# Patient Record
Sex: Male | Born: 1965 | Race: White | Hispanic: No | Marital: Married
Health system: Southern US, Community
[De-identification: ages and names within clinical notes are randomized; demographics above are authoritative.]

---

## 2009-07-17 ENCOUNTER — Ambulatory Visit: Payer: Self-pay | Admitting: Podiatry

## 2012-02-05 ENCOUNTER — Ambulatory Visit: Payer: Self-pay | Admitting: Family Medicine

## 2012-02-19 ENCOUNTER — Ambulatory Visit: Payer: Self-pay | Admitting: Oncology

## 2012-02-19 LAB — CBC CANCER CENTER
Basophil #: 0.1 x10 3/mm (ref 0.0–0.1)
Basophil %: 1 %
Eosinophil #: 0.3 x10 3/mm (ref 0.0–0.7)
HCT: 41.9 % (ref 40.0–52.0)
HGB: 14.3 g/dL (ref 13.0–18.0)
Lymphocyte #: 2.4 x10 3/mm (ref 1.0–3.6)
Lymphocyte %: 24.5 %
MCH: 33.8 pg (ref 26.0–34.0)
MCHC: 34.2 g/dL (ref 32.0–36.0)
MCV: 99 fL (ref 80–100)
Monocyte #: 0.6 x10 3/mm (ref 0.2–1.0)
Neutrophil #: 6.3 x10 3/mm (ref 1.4–6.5)
Neutrophil %: 64.8 %
RDW: 13.2 % (ref 11.5–14.5)

## 2012-02-19 LAB — LACTATE DEHYDROGENASE: LDH: 234 U/L (ref 87–241)

## 2012-02-19 LAB — IRON AND TIBC
Iron Saturation: 16 %
Iron: 66 ug/dL (ref 65–175)

## 2012-02-19 LAB — FOLATE: Folic Acid: 15.6 ng/mL (ref 3.1–100.0)

## 2012-03-12 ENCOUNTER — Ambulatory Visit: Payer: Self-pay | Admitting: Oncology

## 2013-03-15 ENCOUNTER — Ambulatory Visit: Payer: Self-pay | Admitting: Internal Medicine

## 2013-03-16 ENCOUNTER — Inpatient Hospital Stay: Payer: Self-pay | Admitting: Psychiatry

## 2013-03-16 LAB — URINALYSIS, COMPLETE
Glucose,UR: NEGATIVE mg/dL (ref 0–75)
Nitrite: NEGATIVE
Ph: 6 (ref 4.5–8.0)
Protein: 100
RBC,UR: 1 /HPF (ref 0–5)
Specific Gravity: 1.012 (ref 1.003–1.030)
Squamous Epithelial: 1
Transitional Epi: <1
WBC UR: 3 /HPF (ref 0–5)

## 2013-03-16 LAB — CBC
HCT: 44 % (ref 40.0–52.0)
HGB: 15.2 g/dL (ref 13.0–18.0)
MCHC: 34.6 g/dL (ref 32.0–36.0)
MCV: 98 fL (ref 80–100)
Platelet: 81 10*3/uL — ABNORMAL LOW (ref 150–440)
RBC: 4.51 10*6/uL (ref 4.40–5.90)
RDW: 13.3 % (ref 11.5–14.5)

## 2013-03-16 LAB — COMPREHENSIVE METABOLIC PANEL
BUN: 8 mg/dL (ref 7–18)
Chloride: 101 mmol/L (ref 98–107)
Co2: 24 mmol/L (ref 21–32)
Creatinine: 0.72 mg/dL (ref 0.60–1.30)
Osmolality: 274 (ref 275–301)
Potassium: 3.8 mmol/L (ref 3.5–5.1)
SGOT(AST): 137 U/L — ABNORMAL HIGH (ref 15–37)
SGPT (ALT): 67 U/L (ref 12–78)
Sodium: 137 mmol/L (ref 136–145)
Total Protein: 8.4 g/dL — ABNORMAL HIGH (ref 6.4–8.2)

## 2013-03-16 LAB — DRUG SCREEN, URINE
Barbiturates, Ur Screen: NEGATIVE (ref ?–200)
Benzodiazepine, Ur Scrn: NEGATIVE (ref ?–200)
Cannabinoid 50 Ng, Ur ~~LOC~~: NEGATIVE (ref ?–50)
Cocaine Metabolite,Ur ~~LOC~~: NEGATIVE (ref ?–300)
MDMA (Ecstasy)Ur Screen: NEGATIVE (ref ?–500)
Opiate, Ur Screen: NEGATIVE (ref ?–300)
Phencyclidine (PCP) Ur S: NEGATIVE (ref ?–25)

## 2013-03-16 LAB — ETHANOL
Ethanol %: 0.109 % — ABNORMAL HIGH (ref 0.000–0.080)
Ethanol: 295 mg/dL
Ethanol: 109 mg/dL

## 2013-03-22 ENCOUNTER — Ambulatory Visit: Payer: Self-pay | Admitting: Psychiatry

## 2013-06-22 ENCOUNTER — Inpatient Hospital Stay: Payer: Self-pay | Admitting: Psychiatry

## 2013-06-22 LAB — ETHANOL: Ethanol %: 0.085 % — ABNORMAL HIGH (ref 0.000–0.080)

## 2013-06-22 LAB — URINALYSIS, COMPLETE
Bacteria: NONE SEEN
Hyaline Cast: 8
Ph: 6 (ref 4.5–8.0)
RBC,UR: 94 /HPF (ref 0–5)
Squamous Epithelial: 2
WBC UR: 10 /HPF (ref 0–5)

## 2013-06-22 LAB — COMPREHENSIVE METABOLIC PANEL
Alkaline Phosphatase: 149 U/L — ABNORMAL HIGH (ref 50–136)
Anion Gap: 15 (ref 7–16)
BUN: 8 mg/dL (ref 7–18)
Chloride: 100 mmol/L (ref 98–107)
Creatinine: 0.7 mg/dL (ref 0.60–1.30)
EGFR (African American): >60
Osmolality: 274 (ref 275–301)
SGOT(AST): 85 U/L — ABNORMAL HIGH (ref 15–37)
Sodium: 137 mmol/L (ref 136–145)
Total Protein: 7.5 g/dL (ref 6.4–8.2)

## 2013-06-22 LAB — CBC
HCT: 38.4 % — ABNORMAL LOW (ref 40.0–52.0)
HGB: 13.1 g/dL (ref 13.0–18.0)
MCH: 31.7 pg (ref 26.0–34.0)
MCV: 93 fL (ref 80–100)
RDW: 14.9 % — ABNORMAL HIGH (ref 11.5–14.5)

## 2013-06-22 LAB — DRUG SCREEN, URINE
Amphetamines, Ur Screen: NEGATIVE (ref ?–1000)
Benzodiazepine, Ur Scrn: NEGATIVE (ref ?–200)
Cannabinoid 50 Ng, Ur ~~LOC~~: NEGATIVE (ref ?–50)
Cocaine Metabolite,Ur ~~LOC~~: NEGATIVE (ref ?–300)
MDMA (Ecstasy)Ur Screen: NEGATIVE (ref ?–500)
Methadone, Ur Screen: NEGATIVE (ref ?–300)
Phencyclidine (PCP) Ur S: NEGATIVE (ref ?–25)

## 2013-06-26 DIAGNOSIS — R079 Chest pain, unspecified: Secondary | ICD-10-CM

## 2013-06-26 LAB — T4, FREE: Free Thyroxine: 1.39 ng/dL (ref 0.76–1.46)

## 2013-06-26 LAB — CK TOTAL AND CKMB (NOT AT ARMC): CK-MB: 0.7 ng/mL (ref 0.5–3.6)

## 2013-06-27 LAB — TROPONIN I: Troponin-I: <0.02 ng/mL

## 2013-06-27 LAB — CK TOTAL AND CKMB (NOT AT ARMC)
CK, Total: 166 U/L (ref 35–232)
CK, Total: 158 U/L
CK-MB: 0.6 ng/mL (ref 0.5–3.6)
CK-MB: <0.5 ng/mL — ABNORMAL LOW

## 2013-06-28 LAB — COMPREHENSIVE METABOLIC PANEL
Albumin: 3.2 g/dL — ABNORMAL LOW (ref 3.4–5.0)
Anion Gap: 5 — ABNORMAL LOW (ref 7–16)
Chloride: 99 mmol/L (ref 98–107)
Co2: 30 mmol/L (ref 21–32)
EGFR (African American): 52 — ABNORMAL LOW
EGFR (Non-African Amer.): 45 — ABNORMAL LOW
Glucose: 99 mg/dL (ref 65–99)
Osmolality: 277 (ref 275–301)
Potassium: 3.7 mmol/L (ref 3.5–5.1)
SGOT(AST): 101 U/L — ABNORMAL HIGH (ref 15–37)
SGPT (ALT): 43 U/L (ref 12–78)
Sodium: 134 mmol/L — ABNORMAL LOW (ref 136–145)

## 2013-06-28 LAB — MAGNESIUM: Magnesium: 1.3 mg/dL — ABNORMAL LOW

## 2013-06-30 LAB — MAGNESIUM: Magnesium: 0.9 mg/dL — ABNORMAL LOW

## 2013-06-30 LAB — CREATININE, SERUM: Creatinine: 1.06 mg/dL (ref 0.60–1.30)

## 2013-07-23 ENCOUNTER — Emergency Department: Payer: Self-pay | Admitting: Emergency Medicine

## 2013-07-23 LAB — COMPREHENSIVE METABOLIC PANEL
Albumin: 3.4 g/dL (ref 3.4–5.0)
Alkaline Phosphatase: 162 U/L — ABNORMAL HIGH
BUN: 5 mg/dL — ABNORMAL LOW (ref 7–18)
Bilirubin,Total: 0.5 mg/dL (ref 0.2–1.0)
Calcium, Total: 9.4 mg/dL (ref 8.5–10.1)
Co2: 27 mmol/L (ref 21–32)
Creatinine: 0.77 mg/dL (ref 0.60–1.30)
EGFR (Non-African Amer.): >60
Glucose: 114 mg/dL — ABNORMAL HIGH (ref 65–99)
Osmolality: 281 (ref 275–301)
Potassium: 3.5 mmol/L (ref 3.5–5.1)
SGOT(AST): 58 U/L — ABNORMAL HIGH (ref 15–37)
SGPT (ALT): 41 U/L (ref 12–78)
Total Protein: 8.2 g/dL (ref 6.4–8.2)

## 2013-07-23 LAB — CBC
MCH: 30.3 pg (ref 26.0–34.0)
MCV: 92 fL (ref 80–100)
Platelet: 156 10*3/uL (ref 150–440)
RBC: 4.75 10*6/uL (ref 4.40–5.90)
RDW: 14.2 % (ref 11.5–14.5)

## 2013-07-23 LAB — SALICYLATE LEVEL: Salicylates, Serum: <1.7 mg/dL

## 2013-07-23 LAB — ETHANOL
Ethanol %: 0.348 % (ref 0.000–0.080)
Ethanol: 348 mg/dL

## 2013-07-24 LAB — AMMONIA: Ammonia, Plasma: 30 mcmol/L (ref 11–32)

## 2013-09-09 ENCOUNTER — Emergency Department: Payer: Self-pay | Admitting: Emergency Medicine

## 2013-09-09 LAB — COMPREHENSIVE METABOLIC PANEL
ALK PHOS: 134 U/L — AB
AST: 44 U/L — AB (ref 15–37)
Albumin: 3.9 g/dL (ref 3.4–5.0)
Anion Gap: 10 (ref 7–16)
BUN: 10 mg/dL (ref 7–18)
Bilirubin,Total: 0.4 mg/dL (ref 0.2–1.0)
CALCIUM: 9.1 mg/dL (ref 8.5–10.1)
Chloride: 108 mmol/L — ABNORMAL HIGH (ref 98–107)
Co2: 26 mmol/L (ref 21–32)
Creatinine: 0.83 mg/dL (ref 0.60–1.30)
EGFR (Non-African Amer.): >60
Glucose: 92 mg/dL (ref 65–99)
OSMOLALITY: 286 (ref 275–301)
POTASSIUM: 4 mmol/L (ref 3.5–5.1)
SGPT (ALT): 24 U/L (ref 12–78)
SODIUM: 144 mmol/L (ref 136–145)
Total Protein: 8.7 g/dL — ABNORMAL HIGH (ref 6.4–8.2)

## 2013-09-09 LAB — SALICYLATE LEVEL: SALICYLATES, SERUM: 2.8 mg/dL

## 2013-09-09 LAB — ETHANOL
ETHANOL LVL: 383 mg/dL — AB
Ethanol %: 0.383 % (ref 0.000–0.080)

## 2013-09-09 LAB — CBC
HCT: 49.4 % (ref 40.0–52.0)
HGB: 15.9 g/dL (ref 13.0–18.0)
MCH: 29.4 pg (ref 26.0–34.0)
MCHC: 32.2 g/dL (ref 32.0–36.0)
MCV: 91 fL (ref 80–100)
Platelet: 217 10*3/uL (ref 150–440)
RBC: 5.4 10*6/uL (ref 4.40–5.90)
RDW: 14.8 % — ABNORMAL HIGH (ref 11.5–14.5)
WBC: 13.6 10*3/uL — AB (ref 3.8–10.6)

## 2013-09-09 LAB — ACETAMINOPHEN LEVEL: Acetaminophen: <2 ug/mL

## 2013-09-10 LAB — URINALYSIS, COMPLETE
Bilirubin,UR: NEGATIVE
Blood: NEGATIVE
Glucose,UR: NEGATIVE mg/dL (ref 0–75)
Hyaline Cast: 48
Leukocyte Esterase: NEGATIVE
Nitrite: NEGATIVE
Ph: 5 (ref 4.5–8.0)
Protein: 100
RBC,UR: 1 /HPF (ref 0–5)
Specific Gravity: 1.02 (ref 1.003–1.030)
Squamous Epithelial: 1
WBC UR: 3 /HPF (ref 0–5)

## 2013-09-10 LAB — DRUG SCREEN, URINE

## 2013-12-28 ENCOUNTER — Inpatient Hospital Stay: Payer: Self-pay | Admitting: Specialist

## 2013-12-28 LAB — TROPONIN I
TROPONIN-I: 0.03 ng/mL
TROPONIN-I: 0.03 ng/mL
Troponin-I: 0.03 ng/mL

## 2013-12-28 LAB — ETHANOL
ETHANOL %: 0.076 % (ref 0.000–0.080)
ETHANOL LVL: 76 mg/dL

## 2013-12-28 LAB — CBC WITH DIFFERENTIAL/PLATELET
Bands: 4 %
Comment - H1-Com2: NORMAL
HCT: 35.1 % — AB (ref 40.0–52.0)
HGB: 11.6 g/dL — AB (ref 13.0–18.0)
LYMPHS PCT: 10 %
MCH: 33 pg (ref 26.0–34.0)
MCHC: 33.1 g/dL (ref 32.0–36.0)
MCV: 100 fL (ref 80–100)
MONOS PCT: 9 %
Platelet: 110 10*3/uL — ABNORMAL LOW (ref 150–440)
RBC: 3.53 10*6/uL — ABNORMAL LOW (ref 4.40–5.90)
RDW: 22.4 % — AB (ref 11.5–14.5)
Segmented Neutrophils: 77 %
WBC: 10.2 10*3/uL (ref 3.8–10.6)

## 2013-12-28 LAB — COMPREHENSIVE METABOLIC PANEL
Albumin: 2.2 g/dL — ABNORMAL LOW (ref 3.4–5.0)
Alkaline Phosphatase: 521 U/L — ABNORMAL HIGH
Anion Gap: 23 — ABNORMAL HIGH (ref 7–16)
BILIRUBIN TOTAL: 24.6 mg/dL — AB (ref 0.2–1.0)
BUN: 23 mg/dL — ABNORMAL HIGH (ref 7–18)
CREATININE: 1.93 mg/dL — AB (ref 0.60–1.30)
Calcium, Total: 6.4 mg/dL — CL (ref 8.5–10.1)
Chloride: 94 mmol/L — ABNORMAL LOW (ref 98–107)
Co2: 17 mmol/L — ABNORMAL LOW (ref 21–32)
GFR CALC AF AMER: 46 — AB
GFR CALC NON AF AMER: 40 — AB
GLUCOSE: 91 mg/dL (ref 65–99)
OSMOLALITY: 272 (ref 275–301)
POTASSIUM: 2.9 mmol/L — AB (ref 3.5–5.1)
SGOT(AST): 399 U/L — ABNORMAL HIGH (ref 15–37)
SGPT (ALT): 71 U/L (ref 12–78)
Sodium: 134 mmol/L — ABNORMAL LOW (ref 136–145)
Total Protein: 6.1 g/dL — ABNORMAL LOW (ref 6.4–8.2)

## 2013-12-28 LAB — MAGNESIUM: Magnesium: 0.6 mg/dL — ABNORMAL LOW

## 2013-12-28 LAB — APTT: ACTIVATED PTT: 48.8 s — AB (ref 23.6–35.9)

## 2013-12-28 LAB — PROTIME-INR
INR: 1.3
Prothrombin Time: 16.4 secs — ABNORMAL HIGH (ref 11.5–14.7)

## 2013-12-28 LAB — LIPASE, BLOOD: Lipase: 1402 U/L — ABNORMAL HIGH (ref 73–393)

## 2013-12-28 LAB — AMMONIA: Ammonia, Plasma: 87 mcmol/L — ABNORMAL HIGH (ref 11–32)

## 2013-12-28 LAB — HEMOGLOBIN: HGB: 10.7 g/dL — AB (ref 13.0–18.0)

## 2013-12-28 LAB — PRO B NATRIURETIC PEPTIDE: B-Type Natriuretic Peptide: 63 pg/mL (ref 0–125)

## 2013-12-29 LAB — LIPID PANEL
Cholesterol: 224 mg/dL — ABNORMAL HIGH (ref 0–200)
HDL Cholesterol: 5 mg/dL — ABNORMAL LOW (ref 40–60)
Triglycerides: 412 mg/dL — ABNORMAL HIGH (ref 0–200)

## 2013-12-29 LAB — URINALYSIS, COMPLETE
Glucose,UR: NEGATIVE mg/dL (ref 0–75)
Hyaline Cast: 27
NITRITE: NEGATIVE
Ph: 5 (ref 4.5–8.0)
Protein: 30
SQUAMOUS EPITHELIAL: NONE SEEN
Specific Gravity: 1.018 (ref 1.003–1.030)
WBC UR: 19 /HPF (ref 0–5)

## 2013-12-29 LAB — DRUG SCREEN, URINE

## 2013-12-29 LAB — CBC WITH DIFFERENTIAL/PLATELET
Bands: 5 %
COMMENT - H1-COM3: NORMAL
EOS PCT: 1 %
HCT: 33 % — AB (ref 40.0–52.0)
HGB: 11.3 g/dL — ABNORMAL LOW (ref 13.0–18.0)
MCH: 33.6 pg (ref 26.0–34.0)
MCHC: 34.3 g/dL (ref 32.0–36.0)
MCV: 98 fL (ref 80–100)
MONOS PCT: 6 %
Platelet: 85 10*3/uL — ABNORMAL LOW (ref 150–440)
RBC: 3.37 10*6/uL — AB (ref 4.40–5.90)
RDW: 22.2 % — ABNORMAL HIGH (ref 11.5–14.5)
SEGMENTED NEUTROPHILS: 77 %
VARIANT LYMPHOCYTE - H1-RLYMPH: 11 %
WBC: 8 10*3/uL (ref 3.8–10.6)

## 2013-12-29 LAB — PROTIME-INR
INR: 1.5
Prothrombin Time: 17.8 secs — ABNORMAL HIGH (ref 11.5–14.7)

## 2013-12-29 LAB — COMPREHENSIVE METABOLIC PANEL
ALBUMIN: 2.1 g/dL — AB (ref 3.4–5.0)
ALT: 76 U/L (ref 12–78)
AST: 384 U/L — AB (ref 15–37)
Alkaline Phosphatase: 463 U/L — ABNORMAL HIGH
Anion Gap: 17 — ABNORMAL HIGH (ref 7–16)
BILIRUBIN TOTAL: 24 mg/dL — AB (ref 0.2–1.0)
BUN: 27 mg/dL — AB (ref 7–18)
Calcium, Total: 6.4 mg/dL — CL (ref 8.5–10.1)
Chloride: 102 mmol/L (ref 98–107)
Co2: 18 mmol/L — ABNORMAL LOW (ref 21–32)
Creatinine: 1.88 mg/dL — ABNORMAL HIGH (ref 0.60–1.30)
EGFR (African American): 48 — ABNORMAL LOW
EGFR (Non-African Amer.): 41 — ABNORMAL LOW
Glucose: 90 mg/dL (ref 65–99)
Osmolality: 278 (ref 275–301)
POTASSIUM: 2.9 mmol/L — AB (ref 3.5–5.1)
Sodium: 137 mmol/L (ref 136–145)
Total Protein: 5.8 g/dL — ABNORMAL LOW (ref 6.4–8.2)

## 2013-12-29 LAB — HEMOGLOBIN: HGB: 12.1 g/dL — ABNORMAL LOW (ref 13.0–18.0)

## 2013-12-29 LAB — LIPASE, BLOOD: Lipase: 2553 U/L — ABNORMAL HIGH (ref 73–393)

## 2013-12-29 LAB — CLOSTRIDIUM DIFFICILE(ARMC)

## 2013-12-29 LAB — MAGNESIUM: MAGNESIUM: 1.8 mg/dL

## 2013-12-30 LAB — CBC WITH DIFFERENTIAL/PLATELET
HCT: 33.8 % — ABNORMAL LOW (ref 40.0–52.0)
HGB: 11.5 g/dL — AB (ref 13.0–18.0)
LYMPHS PCT: 11 %
MCH: 33.9 pg (ref 26.0–34.0)
MCHC: 34 g/dL (ref 32.0–36.0)
MCV: 100 fL (ref 80–100)
MONOS PCT: 8 %
Platelet: 99 10*3/uL — ABNORMAL LOW (ref 150–440)
RBC: 3.39 10*6/uL — AB (ref 4.40–5.90)
RDW: 22.6 % — AB (ref 11.5–14.5)
Segmented Neutrophils: 81 %
WBC: 8.5 10*3/uL (ref 3.8–10.6)

## 2013-12-30 LAB — COMPREHENSIVE METABOLIC PANEL
ALK PHOS: 432 U/L — AB
Albumin: 2.1 g/dL — ABNORMAL LOW (ref 3.4–5.0)
Anion Gap: 17 — ABNORMAL HIGH (ref 7–16)
BUN: 22 mg/dL — ABNORMAL HIGH (ref 7–18)
Bilirubin,Total: 25.5 mg/dL — ABNORMAL HIGH (ref 0.2–1.0)
CALCIUM: 6.4 mg/dL — AB (ref 8.5–10.1)
CHLORIDE: 110 mmol/L — AB (ref 98–107)
CO2: 15 mmol/L — AB (ref 21–32)
Creatinine: 1.1 mg/dL (ref 0.60–1.30)
EGFR (Non-African Amer.): >60
Glucose: 78 mg/dL (ref 65–99)
OSMOLALITY: 285 (ref 275–301)
Potassium: 3.5 mmol/L (ref 3.5–5.1)
SGOT(AST): 329 U/L — ABNORMAL HIGH (ref 15–37)
SGPT (ALT): 78 U/L (ref 12–78)
Sodium: 142 mmol/L (ref 136–145)
Total Protein: 5.5 g/dL — ABNORMAL LOW (ref 6.4–8.2)

## 2013-12-30 LAB — LIPASE, BLOOD: Lipase: 4733 U/L — ABNORMAL HIGH (ref 73–393)

## 2013-12-30 LAB — WBCS, STOOL

## 2013-12-31 LAB — COMPREHENSIVE METABOLIC PANEL
ALBUMIN: 2 g/dL — AB (ref 3.4–5.0)
AST: 320 U/L — AB (ref 15–37)
Alkaline Phosphatase: 416 U/L — ABNORMAL HIGH
Anion Gap: 12 (ref 7–16)
BUN: 15 mg/dL (ref 7–18)
Bilirubin,Total: 25 mg/dL — ABNORMAL HIGH (ref 0.2–1.0)
Calcium, Total: 7 mg/dL — CL (ref 8.5–10.1)
Chloride: 114 mmol/L — ABNORMAL HIGH (ref 98–107)
Co2: 21 mmol/L (ref 21–32)
Creatinine: 1.17 mg/dL (ref 0.60–1.30)
EGFR (African American): >60
Glucose: 55 mg/dL — ABNORMAL LOW (ref 65–99)
Osmolality: 291 (ref 275–301)
Potassium: 3.4 mmol/L — ABNORMAL LOW (ref 3.5–5.1)
SGPT (ALT): 84 U/L — ABNORMAL HIGH (ref 12–78)
Sodium: 147 mmol/L — ABNORMAL HIGH (ref 136–145)
Total Protein: 5.4 g/dL — ABNORMAL LOW (ref 6.4–8.2)

## 2013-12-31 LAB — PLATELET COUNT: PLATELETS: 112 10*3/uL — AB (ref 150–440)

## 2013-12-31 LAB — HEMOGLOBIN: HGB: 11.6 g/dL — ABNORMAL LOW (ref 13.0–18.0)

## 2013-12-31 LAB — AMMONIA: Ammonia, Plasma: <10 mcmol/L (ref 11–32)

## 2013-12-31 LAB — LIPASE, BLOOD: Lipase: 6218 U/L — ABNORMAL HIGH (ref 73–393)

## 2013-12-31 LAB — STOOL CULTURE

## 2013-12-31 LAB — PROTIME-INR
INR: 1.5
Prothrombin Time: 17.8 secs — ABNORMAL HIGH (ref 11.5–14.7)

## 2014-01-01 LAB — PLATELET COUNT: PLATELETS: 119 10*3/uL — AB (ref 150–440)

## 2014-01-01 LAB — LIPASE, BLOOD: Lipase: 3987 U/L — ABNORMAL HIGH (ref 73–393)

## 2014-01-01 LAB — BASIC METABOLIC PANEL
Anion Gap: 11 (ref 7–16)
BUN: 12 mg/dL (ref 7–18)
CHLORIDE: 110 mmol/L — AB (ref 98–107)
Calcium, Total: 6.6 mg/dL — CL (ref 8.5–10.1)
Co2: 23 mmol/L (ref 21–32)
Creatinine: 1.01 mg/dL (ref 0.60–1.30)
EGFR (Non-African Amer.): >60
GLUCOSE: 82 mg/dL (ref 65–99)
Osmolality: 286 (ref 275–301)
POTASSIUM: 2.7 mmol/L — AB (ref 3.5–5.1)
Sodium: 144 mmol/L (ref 136–145)

## 2014-01-01 LAB — POTASSIUM: Potassium: 3.3 mmol/L — ABNORMAL LOW (ref 3.5–5.1)

## 2014-01-01 LAB — MAGNESIUM: MAGNESIUM: 1.1 mg/dL — AB

## 2014-01-02 LAB — HEPATIC FUNCTION PANEL A (ARMC)
ALK PHOS: 362 U/L — AB
Albumin: 1.8 g/dL — ABNORMAL LOW (ref 3.4–5.0)
BILIRUBIN DIRECT: 20.5 mg/dL — AB (ref 0.00–0.20)
BILIRUBIN TOTAL: 25.2 mg/dL — AB (ref 0.2–1.0)
SGOT(AST): 252 U/L — ABNORMAL HIGH (ref 15–37)
SGPT (ALT): 78 U/L (ref 12–78)
Total Protein: 5 g/dL — ABNORMAL LOW (ref 6.4–8.2)

## 2014-01-02 LAB — URINE CULTURE

## 2014-01-02 LAB — POTASSIUM
POTASSIUM: 3.8 mmol/L (ref 3.5–5.1)
Potassium: 3.3 mmol/L — ABNORMAL LOW (ref 3.5–5.1)

## 2014-01-02 LAB — LIPASE, BLOOD: Lipase: 5744 U/L — ABNORMAL HIGH (ref 73–393)

## 2014-01-02 LAB — PROTIME-INR
INR: 1.6
PROTHROMBIN TIME: 18.5 s — AB (ref 11.5–14.7)

## 2014-01-02 LAB — MAGNESIUM: MAGNESIUM: 2.5 mg/dL — AB

## 2014-01-02 LAB — PLATELET COUNT: Platelet: 152 10*3/uL (ref 150–440)

## 2014-01-03 LAB — COMPREHENSIVE METABOLIC PANEL
ANION GAP: 11 (ref 7–16)
AST: 210 U/L — AB (ref 15–37)
Albumin: 1.7 g/dL — ABNORMAL LOW (ref 3.4–5.0)
Alkaline Phosphatase: 356 U/L — ABNORMAL HIGH
BUN: 19 mg/dL — ABNORMAL HIGH (ref 7–18)
Bilirubin,Total: 30.6 mg/dL — ABNORMAL HIGH (ref 0.2–1.0)
CHLORIDE: 115 mmol/L — AB (ref 98–107)
CO2: 15 mmol/L — AB (ref 21–32)
Calcium, Total: 6.9 mg/dL — CL (ref 8.5–10.1)
Creatinine: 1.56 mg/dL — ABNORMAL HIGH (ref 0.60–1.30)
EGFR (African American): 60 — ABNORMAL LOW
GFR CALC NON AF AMER: 51 — AB
GLUCOSE: 100 mg/dL — AB (ref 65–99)
Osmolality: 284 (ref 275–301)
Potassium: 3.9 mmol/L (ref 3.5–5.1)
SGPT (ALT): 69 U/L (ref 12–78)
Sodium: 141 mmol/L (ref 136–145)
Total Protein: 4.8 g/dL — ABNORMAL LOW (ref 6.4–8.2)

## 2014-01-03 LAB — LIPASE, BLOOD: Lipase: 1482 U/L — ABNORMAL HIGH (ref 73–393)

## 2014-01-04 LAB — COMPREHENSIVE METABOLIC PANEL
ALK PHOS: 346 U/L — AB
ALT: 65 U/L (ref 12–78)
AST: 186 U/L — AB (ref 15–37)
Albumin: 1.7 g/dL — ABNORMAL LOW (ref 3.4–5.0)
Anion Gap: 9 (ref 7–16)
BUN: 19 mg/dL — ABNORMAL HIGH (ref 7–18)
Bilirubin,Total: 28 mg/dL — ABNORMAL HIGH (ref 0.2–1.0)
CALCIUM: 7.2 mg/dL — AB (ref 8.5–10.1)
CREATININE: 1.61 mg/dL — AB (ref 0.60–1.30)
Chloride: 115 mmol/L — ABNORMAL HIGH (ref 98–107)
Co2: 17 mmol/L — ABNORMAL LOW (ref 21–32)
EGFR (African American): 57 — ABNORMAL LOW
EGFR (Non-African Amer.): 49 — ABNORMAL LOW
Glucose: 88 mg/dL (ref 65–99)
Osmolality: 283 (ref 275–301)
Potassium: 3.3 mmol/L — ABNORMAL LOW (ref 3.5–5.1)
Sodium: 141 mmol/L (ref 136–145)
Total Protein: 5.1 g/dL — ABNORMAL LOW (ref 6.4–8.2)

## 2014-01-04 LAB — CBC WITH DIFFERENTIAL/PLATELET
Bands: 4 %
HCT: 37.3 % — ABNORMAL LOW (ref 40.0–52.0)
HGB: 12.5 g/dL — AB (ref 13.0–18.0)
Lymphocytes: 17 %
MCH: 34.8 pg — ABNORMAL HIGH (ref 26.0–34.0)
MCHC: 33.5 g/dL (ref 32.0–36.0)
MCV: 104 fL — ABNORMAL HIGH (ref 80–100)
Metamyelocyte: 1 %
Monocytes: 9 %
PLATELETS: 150 10*3/uL (ref 150–440)
RBC: 3.59 10*6/uL — ABNORMAL LOW (ref 4.40–5.90)
RDW: 23 % — ABNORMAL HIGH (ref 11.5–14.5)
SEGMENTED NEUTROPHILS: 69 %
WBC: 10.9 10*3/uL — ABNORMAL HIGH (ref 3.8–10.6)

## 2014-01-04 LAB — LIPASE, BLOOD: Lipase: 1329 U/L — ABNORMAL HIGH (ref 73–393)

## 2014-01-04 LAB — CLOSTRIDIUM DIFFICILE(ARMC)

## 2014-01-05 LAB — COMPREHENSIVE METABOLIC PANEL
AST: 143 U/L — AB (ref 15–37)
Albumin: 1.5 g/dL — ABNORMAL LOW (ref 3.4–5.0)
Alkaline Phosphatase: 311 U/L — ABNORMAL HIGH
Anion Gap: 10 (ref 7–16)
BILIRUBIN TOTAL: 29.1 mg/dL — AB (ref 0.2–1.0)
BUN: 20 mg/dL — ABNORMAL HIGH (ref 7–18)
CALCIUM: 7.6 mg/dL — AB (ref 8.5–10.1)
CO2: 15 mmol/L — AB (ref 21–32)
Chloride: 116 mmol/L — ABNORMAL HIGH (ref 98–107)
Creatinine: 1.36 mg/dL — ABNORMAL HIGH (ref 0.60–1.30)
EGFR (African American): >60
GLUCOSE: 87 mg/dL (ref 65–99)
OSMOLALITY: 283 (ref 275–301)
Potassium: 2.9 mmol/L — ABNORMAL LOW (ref 3.5–5.1)
SGPT (ALT): 55 U/L (ref 12–78)
Sodium: 141 mmol/L (ref 136–145)
TOTAL PROTEIN: 4.4 g/dL — AB (ref 6.4–8.2)

## 2014-01-05 LAB — PROTIME-INR
INR: 1.7
Prothrombin Time: 19.3 secs — ABNORMAL HIGH (ref 11.5–14.7)

## 2014-01-05 LAB — LIPASE, BLOOD: LIPASE: 1103 U/L — AB (ref 73–393)

## 2014-01-06 LAB — BASIC METABOLIC PANEL
Anion Gap: 8 (ref 7–16)
BUN: 26 mg/dL — ABNORMAL HIGH (ref 7–18)
CHLORIDE: 117 mmol/L — AB (ref 98–107)
Calcium, Total: 7.6 mg/dL — ABNORMAL LOW (ref 8.5–10.1)
Co2: 17 mmol/L — ABNORMAL LOW (ref 21–32)
Creatinine: 1.59 mg/dL — ABNORMAL HIGH (ref 0.60–1.30)
EGFR (African American): 58 — ABNORMAL LOW
EGFR (Non-African Amer.): 50 — ABNORMAL LOW
Glucose: 74 mg/dL (ref 65–99)
OSMOLALITY: 287 (ref 275–301)
Potassium: 3.1 mmol/L — ABNORMAL LOW (ref 3.5–5.1)
Sodium: 142 mmol/L (ref 136–145)

## 2014-01-06 LAB — HEPATIC FUNCTION PANEL A (ARMC)
ALBUMIN: 1.5 g/dL — AB (ref 3.4–5.0)
ALK PHOS: 310 U/L — AB
AST: 137 U/L — AB (ref 15–37)
Bilirubin, Direct: 21.5 mg/dL — ABNORMAL HIGH (ref 0.00–0.20)
Bilirubin,Total: 28.6 mg/dL — ABNORMAL HIGH (ref 0.2–1.0)
SGPT (ALT): 52 U/L (ref 12–78)
TOTAL PROTEIN: 4.5 g/dL — AB (ref 6.4–8.2)

## 2014-01-07 LAB — BASIC METABOLIC PANEL
ANION GAP: 14 (ref 7–16)
BUN: 27 mg/dL — AB (ref 7–18)
CO2: 13 mmol/L — AB (ref 21–32)
CREATININE: 1.57 mg/dL — AB (ref 0.60–1.30)
Calcium, Total: 7.7 mg/dL — ABNORMAL LOW (ref 8.5–10.1)
Chloride: 118 mmol/L — ABNORMAL HIGH (ref 98–107)
EGFR (Non-African Amer.): 51 — ABNORMAL LOW
GFR CALC AF AMER: 59 — AB
GLUCOSE: 79 mg/dL (ref 65–99)
Osmolality: 293 (ref 275–301)
Potassium: 2.8 mmol/L — ABNORMAL LOW (ref 3.5–5.1)
SODIUM: 145 mmol/L (ref 136–145)

## 2014-01-08 LAB — AMMONIA: AMMONIA, PLASMA: 48 umol/L — AB (ref 11–32)

## 2014-01-08 LAB — BASIC METABOLIC PANEL
ANION GAP: 9 (ref 7–16)
BUN: 26 mg/dL — ABNORMAL HIGH (ref 7–18)
CO2: 15 mmol/L — AB (ref 21–32)
CREATININE: 1.52 mg/dL — AB (ref 0.60–1.30)
Calcium, Total: 7.8 mg/dL — ABNORMAL LOW (ref 8.5–10.1)
Chloride: 119 mmol/L — ABNORMAL HIGH (ref 98–107)
EGFR (African American): >60
EGFR (Non-African Amer.): 53 — ABNORMAL LOW
GLUCOSE: 80 mg/dL (ref 65–99)
Osmolality: 289 (ref 275–301)
POTASSIUM: 2.9 mmol/L — AB (ref 3.5–5.1)
SODIUM: 143 mmol/L (ref 136–145)

## 2014-01-08 LAB — HEPATIC FUNCTION PANEL A (ARMC)
ALBUMIN: 2.8 g/dL — AB (ref 3.4–5.0)
AST: 112 U/L — AB (ref 15–37)
Alkaline Phosphatase: 221 U/L — ABNORMAL HIGH
Bilirubin, Direct: 21.8 mg/dL — ABNORMAL HIGH (ref 0.00–0.20)
Bilirubin,Total: 28.5 mg/dL — ABNORMAL HIGH (ref 0.2–1.0)
SGPT (ALT): 39 U/L (ref 12–78)
Total Protein: 4.6 g/dL — ABNORMAL LOW (ref 6.4–8.2)

## 2014-01-08 LAB — PROTIME-INR
INR: 1.5
Prothrombin Time: 18.2 secs — ABNORMAL HIGH (ref 11.5–14.7)

## 2014-01-10 ENCOUNTER — Ambulatory Visit: Payer: Self-pay | Admitting: Internal Medicine

## 2014-01-12 ENCOUNTER — Emergency Department: Payer: Self-pay | Admitting: Emergency Medicine

## 2014-01-12 LAB — CBC
HCT: 34.3 % — ABNORMAL LOW (ref 40.0–52.0)
HGB: 11.5 g/dL — ABNORMAL LOW (ref 13.0–18.0)
MCH: 35.1 pg — ABNORMAL HIGH (ref 26.0–34.0)
MCHC: 33.6 g/dL (ref 32.0–36.0)
MCV: 105 fL — ABNORMAL HIGH (ref 80–100)
PLATELETS: 147 10*3/uL — AB (ref 150–440)
RBC: 3.29 10*6/uL — AB (ref 4.40–5.90)
RDW: 18.7 % — ABNORMAL HIGH (ref 11.5–14.5)
WBC: 16.2 10*3/uL — AB (ref 3.8–10.6)

## 2014-01-12 LAB — COMPREHENSIVE METABOLIC PANEL
ALBUMIN: 2.3 g/dL — AB (ref 3.4–5.0)
AST: 142 U/L — AB (ref 15–37)
Alkaline Phosphatase: 333 U/L — ABNORMAL HIGH
Anion Gap: 8 (ref 7–16)
BILIRUBIN TOTAL: 30.8 mg/dL — AB (ref 0.2–1.0)
BUN: 21 mg/dL — ABNORMAL HIGH (ref 7–18)
CHLORIDE: 116 mmol/L — AB (ref 98–107)
Calcium, Total: 8.2 mg/dL — ABNORMAL LOW (ref 8.5–10.1)
Co2: 20 mmol/L — ABNORMAL LOW (ref 21–32)
Creatinine: 1.58 mg/dL — ABNORMAL HIGH (ref 0.60–1.30)
EGFR (African American): 59 — ABNORMAL LOW
EGFR (Non-African Amer.): 51 — ABNORMAL LOW
GLUCOSE: 124 mg/dL — AB (ref 65–99)
Osmolality: 291 (ref 275–301)
Potassium: 3.1 mmol/L — ABNORMAL LOW (ref 3.5–5.1)
SGPT (ALT): 63 U/L (ref 12–78)
Sodium: 144 mmol/L (ref 136–145)
TOTAL PROTEIN: 5.1 g/dL — AB (ref 6.4–8.2)

## 2014-01-12 LAB — AMMONIA: Ammonia, Plasma: <10 umol/L

## 2014-01-18 ENCOUNTER — Ambulatory Visit: Payer: Self-pay | Admitting: Gastroenterology

## 2014-01-23 ENCOUNTER — Emergency Department: Payer: Self-pay | Admitting: Emergency Medicine

## 2014-01-23 LAB — CBC WITH DIFFERENTIAL/PLATELET
Basophil #: 0.1 10*3/uL (ref 0.0–0.1)
Basophil %: 0.3 %
EOS ABS: 0.1 10*3/uL (ref 0.0–0.7)
Eosinophil %: 0.4 %
HCT: 38.8 % — AB (ref 40.0–52.0)
HGB: 12.7 g/dL — ABNORMAL LOW (ref 13.0–18.0)
Lymphocyte #: 1.2 10*3/uL (ref 1.0–3.6)
Lymphocyte %: 3.7 %
MCH: 34.3 pg — ABNORMAL HIGH (ref 26.0–34.0)
MCHC: 32.9 g/dL (ref 32.0–36.0)
MCV: 104 fL — ABNORMAL HIGH (ref 80–100)
Monocyte #: 0.6 x10 3/mm (ref 0.2–1.0)
Monocyte %: 1.7 %
NEUTROS ABS: 31 10*3/uL — AB (ref 1.4–6.5)
NEUTROS PCT: 93.9 %
PLATELETS: 218 10*3/uL (ref 150–440)
RBC: 3.71 10*6/uL — AB (ref 4.40–5.90)
RDW: 17.6 % — ABNORMAL HIGH (ref 11.5–14.5)
WBC: 33 10*3/uL — AB (ref 3.8–10.6)

## 2014-01-23 LAB — COMPREHENSIVE METABOLIC PANEL
ALBUMIN: 2 g/dL — AB (ref 3.4–5.0)
ALK PHOS: 603 U/L — AB
ANION GAP: 12 (ref 7–16)
BUN: 36 mg/dL — ABNORMAL HIGH (ref 7–18)
Bilirubin,Total: 33.4 mg/dL — ABNORMAL HIGH (ref 0.2–1.0)
CO2: 19 mmol/L — AB (ref 21–32)
Calcium, Total: 8 mg/dL — ABNORMAL LOW (ref 8.5–10.1)
Chloride: 106 mmol/L (ref 98–107)
Creatinine: 1.98 mg/dL — ABNORMAL HIGH (ref 0.60–1.30)
GFR CALC AF AMER: 44 — AB
GFR CALC NON AF AMER: 38 — AB
Glucose: 180 mg/dL — ABNORMAL HIGH (ref 65–99)
OSMOLALITY: 287 (ref 275–301)
POTASSIUM: 2.5 mmol/L — AB (ref 3.5–5.1)
SGOT(AST): 149 U/L — ABNORMAL HIGH (ref 15–37)
SGPT (ALT): 104 U/L — ABNORMAL HIGH (ref 12–78)
Sodium: 137 mmol/L (ref 136–145)
Total Protein: 5.1 g/dL — ABNORMAL LOW (ref 6.4–8.2)

## 2014-01-23 LAB — URINALYSIS, COMPLETE
Broad Cast: 2
Glucose,UR: NEGATIVE mg/dL (ref 0–75)
Ketone: NEGATIVE
Nitrite: POSITIVE
PH: 5 (ref 4.5–8.0)
Protein: NEGATIVE
Specific Gravity: 1.011 (ref 1.003–1.030)

## 2014-01-23 LAB — LIPASE, BLOOD: LIPASE: 397 U/L — AB (ref 73–393)

## 2014-01-23 LAB — TROPONIN I

## 2014-01-23 LAB — AMMONIA: Ammonia, Plasma: <10 mcmol/L (ref 11–32)

## 2014-01-26 LAB — URINE CULTURE

## 2014-01-31 ENCOUNTER — Inpatient Hospital Stay: Payer: Self-pay | Admitting: Internal Medicine

## 2014-01-31 LAB — URINALYSIS, COMPLETE
Glucose,UR: NEGATIVE mg/dL (ref 0–75)
KETONE: NEGATIVE
Leukocyte Esterase: NEGATIVE
Nitrite: NEGATIVE
PH: 6 (ref 4.5–8.0)
Protein: NEGATIVE
Specific Gravity: 1.008 (ref 1.003–1.030)
Squamous Epithelial: 1
WBC UR: 3 /HPF (ref 0–5)

## 2014-01-31 LAB — PROTIME-INR
INR: 1.9
PROTHROMBIN TIME: 21.6 s — AB (ref 11.5–14.7)

## 2014-01-31 LAB — COMPREHENSIVE METABOLIC PANEL
ALK PHOS: 767 U/L — AB
ALT: 120 U/L — AB (ref 12–78)
ANION GAP: 14 (ref 7–16)
Albumin: 1.7 g/dL — ABNORMAL LOW (ref 3.4–5.0)
BUN: 73 mg/dL — ABNORMAL HIGH (ref 7–18)
Bilirubin,Total: 29.8 mg/dL — ABNORMAL HIGH (ref 0.2–1.0)
CHLORIDE: 100 mmol/L (ref 98–107)
CO2: 16 mmol/L — AB (ref 21–32)
Calcium, Total: 8.1 mg/dL — ABNORMAL LOW (ref 8.5–10.1)
Creatinine: 4.04 mg/dL — ABNORMAL HIGH (ref 0.60–1.30)
EGFR (Non-African Amer.): 16 — ABNORMAL LOW
GFR CALC AF AMER: 19 — AB
GLUCOSE: 142 mg/dL — AB (ref 65–99)
Osmolality: 285 (ref 275–301)
Potassium: 3.7 mmol/L (ref 3.5–5.1)
SGOT(AST): 228 U/L — ABNORMAL HIGH (ref 15–37)
SODIUM: 130 mmol/L — AB (ref 136–145)
Total Protein: 4.4 g/dL — ABNORMAL LOW (ref 6.4–8.2)

## 2014-01-31 LAB — CBC
HCT: 36 % — AB (ref 40.0–52.0)
HGB: 11.8 g/dL — ABNORMAL LOW (ref 13.0–18.0)
MCH: 34.2 pg — AB (ref 26.0–34.0)
MCHC: 32.9 g/dL (ref 32.0–36.0)
MCV: 104 fL — ABNORMAL HIGH (ref 80–100)
PLATELETS: 191 10*3/uL (ref 150–440)
RBC: 3.46 10*6/uL — ABNORMAL LOW (ref 4.40–5.90)
RDW: 17.4 % — ABNORMAL HIGH (ref 11.5–14.5)
WBC: 37.6 10*3/uL — ABNORMAL HIGH (ref 3.8–10.6)

## 2014-01-31 LAB — HEMOGLOBIN: HGB: 11.3 g/dL — ABNORMAL LOW (ref 13.0–18.0)

## 2014-01-31 LAB — AMMONIA: Ammonia, Plasma: <10 mcmol/L (ref 11–32)

## 2014-01-31 LAB — TROPONIN I: Troponin-I: 0.05 ng/mL

## 2014-02-01 LAB — CBC WITH DIFFERENTIAL/PLATELET
BASOS ABS: 0 10*3/uL (ref 0.0–0.1)
Basophil %: 0.1 %
EOS ABS: 0 10*3/uL (ref 0.0–0.7)
Eosinophil %: 0.2 %
HCT: 30.8 % — ABNORMAL LOW (ref 40.0–52.0)
HGB: 10.4 g/dL — ABNORMAL LOW (ref 13.0–18.0)
LYMPHS ABS: 2.2 10*3/uL (ref 1.0–3.6)
Lymphocyte %: 7.6 %
MCH: 34.6 pg — AB (ref 26.0–34.0)
MCHC: 33.9 g/dL (ref 32.0–36.0)
MCV: 102 fL — ABNORMAL HIGH (ref 80–100)
MONO ABS: 0.7 x10 3/mm (ref 0.2–1.0)
MONOS PCT: 2.5 %
NEUTROS ABS: 25.5 10*3/uL — AB (ref 1.4–6.5)
NEUTROS PCT: 89.6 %
PLATELETS: 128 10*3/uL — AB (ref 150–440)
RBC: 3.01 10*6/uL — AB (ref 4.40–5.90)
RDW: 17.5 % — ABNORMAL HIGH (ref 11.5–14.5)
WBC: 28.4 10*3/uL — ABNORMAL HIGH (ref 3.8–10.6)

## 2014-02-01 LAB — COMPREHENSIVE METABOLIC PANEL
Albumin: 2.2 g/dL — ABNORMAL LOW (ref 3.4–5.0)
Alkaline Phosphatase: 654 U/L — ABNORMAL HIGH
Anion Gap: 16 (ref 7–16)
BUN: 84 mg/dL — ABNORMAL HIGH (ref 7–18)
Bilirubin,Total: 31.4 mg/dL — ABNORMAL HIGH (ref 0.2–1.0)
CO2: 16 mmol/L — AB (ref 21–32)
CREATININE: 4.25 mg/dL — AB (ref 0.60–1.30)
Calcium, Total: 7.9 mg/dL — ABNORMAL LOW (ref 8.5–10.1)
Chloride: 103 mmol/L (ref 98–107)
EGFR (African American): 18 — ABNORMAL LOW
EGFR (Non-African Amer.): 15 — ABNORMAL LOW
Glucose: 85 mg/dL (ref 65–99)
Osmolality: 295 (ref 275–301)
POTASSIUM: 3.3 mmol/L — AB (ref 3.5–5.1)
SGOT(AST): 214 U/L — ABNORMAL HIGH (ref 15–37)
SGPT (ALT): 112 U/L — ABNORMAL HIGH (ref 12–78)
Sodium: 135 mmol/L — ABNORMAL LOW (ref 136–145)
Total Protein: 4.2 g/dL — ABNORMAL LOW (ref 6.4–8.2)

## 2014-02-01 LAB — HEPATIC FUNCTION PANEL A (ARMC): Bilirubin, Direct: 24 mg/dL — ABNORMAL HIGH (ref 0.00–0.20)

## 2014-02-01 LAB — AFP TUMOR MARKER: AFP TUMOR MARKER: 2.1 ng/mL (ref 0.0–8.3)

## 2014-02-01 LAB — PROTIME-INR
INR: 1.9
Prothrombin Time: 21.6 secs — ABNORMAL HIGH (ref 11.5–14.7)

## 2014-02-01 LAB — CLOSTRIDIUM DIFFICILE(ARMC)

## 2014-02-01 LAB — AMMONIA: Ammonia, Plasma: <10 mcmol/L (ref 11–32)

## 2014-02-02 LAB — COMPREHENSIVE METABOLIC PANEL
AST: 223 U/L — AB (ref 15–37)
Albumin: 1.9 g/dL — ABNORMAL LOW (ref 3.4–5.0)
Alkaline Phosphatase: 590 U/L — ABNORMAL HIGH
Anion Gap: 14 (ref 7–16)
BUN: 73 mg/dL — ABNORMAL HIGH (ref 7–18)
Bilirubin,Total: 28.7 mg/dL — ABNORMAL HIGH (ref 0.2–1.0)
CHLORIDE: 107 mmol/L (ref 98–107)
CREATININE: 4.04 mg/dL — AB (ref 0.60–1.30)
Calcium, Total: 7.5 mg/dL — ABNORMAL LOW (ref 8.5–10.1)
Co2: 16 mmol/L — ABNORMAL LOW (ref 21–32)
EGFR (African American): 19 — ABNORMAL LOW
EGFR (Non-African Amer.): 16 — ABNORMAL LOW
GLUCOSE: 87 mg/dL (ref 65–99)
Osmolality: 295 (ref 275–301)
POTASSIUM: 3.5 mmol/L (ref 3.5–5.1)
SGPT (ALT): 108 U/L — ABNORMAL HIGH (ref 12–78)
Sodium: 137 mmol/L (ref 136–145)
Total Protein: 3.8 g/dL — ABNORMAL LOW (ref 6.4–8.2)

## 2014-02-02 LAB — CBC WITH DIFFERENTIAL/PLATELET
BASOS PCT: 0.2 %
Basophil #: 0 10*3/uL (ref 0.0–0.1)
Eosinophil #: 0.1 10*3/uL (ref 0.0–0.7)
Eosinophil %: 0.2 %
HCT: 29.6 % — AB (ref 40.0–52.0)
HGB: 10 g/dL — AB (ref 13.0–18.0)
Lymphocyte #: 1.8 10*3/uL (ref 1.0–3.6)
Lymphocyte %: 6.8 %
MCH: 34.3 pg — ABNORMAL HIGH (ref 26.0–34.0)
MCHC: 33.7 g/dL (ref 32.0–36.0)
MCV: 102 fL — ABNORMAL HIGH (ref 80–100)
Monocyte #: 0.7 x10 3/mm (ref 0.2–1.0)
Monocyte %: 2.5 %
Neutrophil #: 23.7 10*3/uL — ABNORMAL HIGH (ref 1.4–6.5)
Neutrophil %: 90.3 %
PLATELETS: 107 10*3/uL — AB (ref 150–440)
RBC: 2.9 10*6/uL — AB (ref 4.40–5.90)
RDW: 17.1 % — ABNORMAL HIGH (ref 11.5–14.5)
WBC: 26.3 10*3/uL — ABNORMAL HIGH (ref 3.8–10.6)

## 2014-02-03 LAB — COMPREHENSIVE METABOLIC PANEL
Albumin: 3.3 g/dL — ABNORMAL LOW (ref 3.4–5.0)
Alkaline Phosphatase: 477 U/L — ABNORMAL HIGH
Anion Gap: 14 (ref 7–16)
BILIRUBIN TOTAL: 26.1 mg/dL — AB (ref 0.2–1.0)
BUN: 66 mg/dL — ABNORMAL HIGH (ref 7–18)
Calcium, Total: 7.2 mg/dL — ABNORMAL LOW (ref 8.5–10.1)
Chloride: 107 mmol/L (ref 98–107)
Co2: 15 mmol/L — ABNORMAL LOW (ref 21–32)
Creatinine: 3.54 mg/dL — ABNORMAL HIGH (ref 0.60–1.30)
EGFR (African American): 22 — ABNORMAL LOW
EGFR (Non-African Amer.): 19 — ABNORMAL LOW
GLUCOSE: 67 mg/dL (ref 65–99)
Osmolality: 289 (ref 275–301)
Potassium: 3.4 mmol/L — ABNORMAL LOW (ref 3.5–5.1)
SGOT(AST): 181 U/L — ABNORMAL HIGH (ref 15–37)
SGPT (ALT): 89 U/L — ABNORMAL HIGH (ref 12–78)
Sodium: 136 mmol/L (ref 136–145)
Total Protein: 4.6 g/dL — ABNORMAL LOW (ref 6.4–8.2)

## 2014-02-03 LAB — STOOL CULTURE

## 2014-02-04 LAB — CBC WITH DIFFERENTIAL/PLATELET
BASOS ABS: 0 10*3/uL (ref 0.0–0.1)
Basophil %: 0.1 %
EOS PCT: 0.9 %
Eosinophil #: 0.2 10*3/uL (ref 0.0–0.7)
HCT: 24.6 % — ABNORMAL LOW (ref 40.0–52.0)
HGB: 8.3 g/dL — ABNORMAL LOW (ref 13.0–18.0)
LYMPHS PCT: 7.6 %
Lymphocyte #: 1.3 10*3/uL (ref 1.0–3.6)
MCH: 35.2 pg — ABNORMAL HIGH (ref 26.0–34.0)
MCHC: 33.9 g/dL (ref 32.0–36.0)
MCV: 104 fL — AB (ref 80–100)
MONOS PCT: 1.9 %
Monocyte #: 0.3 x10 3/mm (ref 0.2–1.0)
NEUTROS ABS: 15.5 10*3/uL — AB (ref 1.4–6.5)
Neutrophil %: 89.5 %
PLATELETS: 73 10*3/uL — AB (ref 150–440)
RBC: 2.37 10*6/uL — ABNORMAL LOW (ref 4.40–5.90)
RDW: 17.7 % — ABNORMAL HIGH (ref 11.5–14.5)
WBC: 17.3 10*3/uL — AB (ref 3.8–10.6)

## 2014-02-04 LAB — COMPREHENSIVE METABOLIC PANEL
ALT: 80 U/L — AB (ref 12–78)
Albumin: 2.6 g/dL — ABNORMAL LOW (ref 3.4–5.0)
Alkaline Phosphatase: 442 U/L — ABNORMAL HIGH
Anion Gap: 14 (ref 7–16)
BUN: 58 mg/dL — ABNORMAL HIGH (ref 7–18)
Bilirubin,Total: 27.6 mg/dL — ABNORMAL HIGH (ref 0.2–1.0)
CHLORIDE: 114 mmol/L — AB (ref 98–107)
CREATININE: 3.7 mg/dL — AB (ref 0.60–1.30)
Calcium, Total: 7.5 mg/dL — ABNORMAL LOW (ref 8.5–10.1)
Co2: 14 mmol/L — ABNORMAL LOW (ref 21–32)
EGFR (African American): 21 — ABNORMAL LOW
EGFR (Non-African Amer.): 18 — ABNORMAL LOW
Glucose: 70 mg/dL (ref 65–99)
OSMOLALITY: 298 (ref 275–301)
POTASSIUM: 4 mmol/L (ref 3.5–5.1)
SGOT(AST): 151 U/L — ABNORMAL HIGH (ref 15–37)
SODIUM: 142 mmol/L (ref 136–145)
Total Protein: 3.8 g/dL — ABNORMAL LOW (ref 6.4–8.2)

## 2014-02-04 LAB — PROTIME-INR
INR: 2
Prothrombin Time: 22.1 secs — ABNORMAL HIGH (ref 11.5–14.7)

## 2014-02-06 LAB — CULTURE, BLOOD (SINGLE)

## 2014-02-09 ENCOUNTER — Ambulatory Visit: Payer: Self-pay | Admitting: Internal Medicine

## 2014-02-09 DEATH — deceased

## 2014-12-02 NOTE — H&P (Signed)
PATIENT NAME:  Miguel Soto, Miguel Soto DATE OF BIRTH:  17-Jun-1966  DATE OF ADMISSION:  06/22/2013  DATE OF ASSESSMENT:  06/23/2013   IDENTIFYING INFORMATION AND CHIEF COMPLAINT: This is a 49 year old man with a history of alcohol dependence and depression.   CHIEF COMPLAINT: "I need to stop drinking."   HISTORY OF PRESENT ILLNESS: Information obtained from the patient and the chart. He presented to the Emergency Room, reporting that he had been drinking more heavily recently. His wife says it has been about 2 pints a day lately. He has been drinking steadily for a few months. In the past, he has been able to get detoxed and stay sober for a few months at a time. What specifically seems to have prompted this admission is that he lost his job yesterday, after his employer had initially given him a break and had supported previous substance abuse treatment. He is also motivated by the fact that his wife and family are fed up with him. His mood stays down and depressed. He denies any suicidal ideation. Denies any hallucinations. He says he has been compliant with his medicine, although he cannot remember the names of his medicines. He denies that he is abusing any other drugs.   PAST PSYCHIATRIC HISTORY: The patient has had previous hospitalizations here for detox, and also has followed up in the intensive outpatient program in the past. Other than that, he has not gotten any other outpatient treatment. He has a history of blackouts, no known history of seizures or delirium tremens. He is being prescribed Lexapro for depression. It is not clear whether he has a definite depression separate from his drinking.   SUBSTANCE ABUSE HISTORY: He reports that he started drinking when his job first went away, shortly after 2001. 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 He has been through detox and has been able to stop for up to  several months at a time. Denies that he is abusing other drugs.   PAST MEDICAL HISTORY: The patient has gout.   FAMILY HISTORY: Positive for alcohol abuse.   SOCIAL HISTORY: Married, two children at home, adolescents. Just lost his most recent job. Wife is reasonably supportive, but seems to be getting more fed up with him, from what I see in the notes. The patient expresses that he is committed to trying to be a better father and husband.   CURRENT MEDICATIONS: Lexapro 10 mg a day, allopurinol 300 mg a day, amlodipine 5 mg a day, colchicine probably 0.6 mg daily, hydrochlorothiazide/lisinopril 25/20 combined once a day.   ALLERGIES: ERYTHROMYCIN.   REVIEW OF SYSTEMS: Currently complaining of severe pain in his right foot. Feels a little bit dizzy. Mood is a little bit down. Denies suicidal ideation. Denies hallucinations. No nausea, no vomiting. No other specific complaints.   MENTAL STATUS EXAMINATION: A shaky, kind of sick-looking gentleman, interviewed in his hospital room. Hygiene was impaired. He was awake and alert, however. Eye contact was intermittent. Psychomotor activity marked by a general tremulousness. Speech normal rate, tone and volume. Affect a little bit anxious and withdrawn. Mood stated as being depressed. Thoughts are generally lucid, with no obvious loosening of associations or delusions. Denies hallucinations. Denies suicidal or homicidal ideation. Shows reasonable judgment and insight right now. Normal intelligence. Alert and oriented x4.   PHYSICAL EXAMINATION: SKIN: No acute skin lesions identified.  HEENT: Pupils equal and reactive.  Face symmetric. Oral mucosa dry. Diffuse tremor all over with motion. Not able to stand up because of the gout in his right foot. Strength and reflexes otherwise symmetric throughout. Cranial nerves symmetric and normal.  LUNGS: Clear, with no wheezes.  HEART: Regular rate and rhythm.  ABDOMEN: Soft, nontender, normal bowel sounds.  VITAL  SIGNS: Blood pressure 129/78, respirations 18, pulse 85, temperature 98.5.  EXTREMITIES:  Right foot does look swollen, red and is very tender to even light touch.   LABORATORY RESULTS: Drug screen negative. TSH slightly elevated at 5.1. Alcohol 85. Chemistry panel: Low potassium 3.4, elevated bilirubin 1.1, elevated alkaline phosphatase 149, elevated AST 85, albumin low at 3.0. Hematology panel shows a low hematocrit at 38.4, low platelet count at 54. Urinalysis 2+ blood.   ASSESSMENT: This is a 49 year old man with alcohol dependence who is very tremulous, shaky, also, depressed, unable to stop drinking on his own, suffering major personal losses and physical impairment.   TREATMENT PLAN: Admit to psychiatry. Alcohol withdrawal protocol. Restarted his medicines for his gout, including a stat dose of 1.2 mg of colchicine and the standing 0.6 mg a day, plus a p.r.n. dose of indomethacin. Work on getting him engaged in groups and activities on the unit. If it is possible, I would recommend either [facility] or the IOP program again. We will try and get his family involved, if possible.   DIAGNOSIS, PRINCIPAL AND PRIMARY:  AXIS I: Alcohol dependence.   SECONDARY DIAGNOSES: AXIS I: Depression, not otherwise specified.   AXIS II: Deferred.   AXIS III: Gout. Alcohol withdrawal symptoms. Hypertension.   AXIS IV: Severe from losses because of substance abuse.   AXIS V: Functioning at time of evaluation is 35.     ____________________________ Audery Amel, MD jtc:cg D: 06/23/2013 23:20:40 ET T: 06/23/2013 23:50:49 ET JOB#: [id number]  cc: Audery Amel, MD, <Dictator> Audery Amel MD ELECTRONICALLY SIGNED 06/24/2013 11:13

## 2014-12-02 NOTE — Consult Note (Signed)
Brief Consult Note: Diagnosis: Alcohol dependence.   Patient was seen by consultant.   Consult note dictated.   Recommend further assessment or treatment.   Orders entered.   Comments: [name] has a h/o alcoholism. He relapsed on alcohol immediately following discharge from a rehab facility. He has been drinking 2 pints of vodka daily. The wife is taking 50B. He will not be able to return home.   PLAN: 1. Will refer to [facility].  2. Will continue CIWA. I will add standing Librium to prevent withdrawal symptoms and limit ativan use.  3. I will restart gout medications.  4. I will follow along..  Electronic Signatures: Miguel Soto, Miguel Soto (MD)  (Signed 13-Dec-14 16:54)  Authored: Brief Consult Note   Last Updated: 13-Dec-14 16:54 by Miguel Soto, Miguel Soto (MD)

## 2014-12-02 NOTE — Consult Note (Signed)
Brief Consult Note: Diagnosis: Alcohol dependence.   Patient was seen by consultant.   Consult note dictated.   Recommend further assessment or treatment.   Orders entered.   Comments: [name] has a h/o alcoholism. He relapsed on alcohol immediately following discharge from a rehab facility. He has been drinking 2 pints of vodka daily. The wife is taking 50B. He will not be able to return home.   PLAN: 1. P is awaiting [facility] bed.  2. Will continue CIWA. I will add standing Librium to prevent withdrawal symptoms and limit ativan use.  3. I will restart gout medications.  4. I will follow along.  Electronic Signatures: Miguel Soto, Miguel Soto (MD)  (Signed 14-Dec-14 13:31)  Authored: Brief Consult Note   Last Updated: 14-Dec-14 13:31 by Miguel Soto, Miguel Soto (MD)

## 2014-12-02 NOTE — Consult Note (Signed)
Consult: treatment recommendations Patient was seen as requested by attending MD to explore option of [facility] CD-IOP at this discharge and was seen as a prime candidate for treatment at this level of care and has agreed to participate in the CD-IOP after Discharge from the Behavioral Medicine Inpatient Unit. He admitted to abuse of Alcoholic Beverage with a desire to discontinue usage. He believes that he could benefit from CD-IOP. He was also focused on his return to his place of employment and expressed desire to be discharged as early as possible. This Clinical research associatewriter explained plans of assessing him further on Monday and the possibility of beginning CD-IOP on the same day of his inpatient discharge. He was oriented and an informational sheet of the CD-IOP was given to the patient. Assigned Nurse informed of assessment recommendations which includes his plans to follow CD-IOP at this inpatient discharge.   Electronic Signatures: Huel Cotehomas, Richard (PsyD)  (Signed on 08-Aug-14 17:43)  Authored  Last Updated: 08-Aug-14 17:43 by Huel Cotehomas, Richard (PsyD)

## 2014-12-02 NOTE — Discharge Summary (Signed)
PATIENT NAME:  Miguel Soto, Miguel Soto MR#:  [id number] DATE OF BIRTH:  05-Apr-1966  DATE OF ADMISSION:  06/22/2013 DATE OF DISCHARGE:  07/01/2013  HOSPITAL COURSE:  See dictated history and physical for details of the admission.  This 49 year old gentleman with a history of alcohol dependence came in to the hospital intoxicated, going through alcohol withdrawal and needing monitoring for alcohol detox.  Initially, he was only a little bit shaky, but after a day or so, he started to go into delirium tremens.  He spent several days, and especially over this past weekend, quite delirious, needed a very large amount of Ativan for a few days. He was confused, frequently trying to get out of bed, had a fall when he did manage to get out of bed, also became incontinent of stool briefly. He had an extended period of diarrhea.  Ultimately, all of that has now cleared up.  He is off of all detox medicines and is lucid and no longer delirious.  He is ambulating without difficulty and is back to what appears to be his baseline mental state, able to cooperate with group therapy.  He has continued his Lexapro 10 mg a day throughout  his hospital stay. He has continued his medicine for gout.  On the first or second day in the hospital, he had an acute flare-up of his gout because he had been off of his allopurinol and colchicine.  Once those were restarted, it has taken care of the gout problem. He is currently being treated for hypomagnesemia and is taking magnesium oxide supplement 3 times a day.  This should probably be rechecked at some point. His magnesium today is still quite low. He is no longer being given standing blood pressure medicines. His blood pressure, after going through detox, is now normal without any medication.  The patient denies any suicidal ideation.  He expresses an understanding of the seriousness of the problem with his alcohol use, and he is agreeable to going [facility].  He has been  counseled extensively, both in individual and group therapy about the importance of getting sober, and has been able to participate well with that and show good insight. At this point, he is not acutely dangerous and will be voluntarily discharged with plan for his wife to take him to [facility].  DISCHARGE MEDICATIONS:  Indomethacin 50 mg 1 capsule twice a day as needed for pain in the foot from gout, trazodone 100 mg at night as needed for sleep, escitalopram 10 mg per day, colchicine 0.6 mg once a day, allopurinol 300 mg once a day, magnesium 400 mg every 8 hours for the time being, pantoprazole 40 mg twice a day.  LABORATORY RESULTS:  Admission labs showed a negative drug screen. Elevated TSH at 5.3. Alcohol 85.  Chemistry panel:  Elevated alkaline phosphatase of 149, elevated bilirubin of 1.1, low potassium  of 3.4, low total albumin at 3.0.  CBC showed a platelet count of only 54.  Followup CBC shows his platelet count has come up to 118.  His creatinine has been followed and is now in the normal range.  Magnesium at the last check was 1.1.  MENTAL STATUS EXAMINATION:  Neatly dressed, alert and oriented, good eye contact, normal psychomotor activity.  Normal speech, rate and tone.  Affect mildly blunted.  Mood stated as being apprehensive but shows appropriate reactivity.  Denies suicidal or homicidal ideation.  Has good understanding of the need for treatment.  Has  positive plans for the future.  Denies any hallucinations.  Normal intelligence.  DISPOSITION:  Discharged with his wife.  Plan to go directly to [facility] for intake tonight.  DIAGNOSIS, PRINCIPAL AND PRIMARY: AXIS I:  Alcohol dependence.  SECONDARY DIAGNOSIS:  AXIS I:  Depression, not otherwise specified. AXIS II:  Deferred. AXIS III:  Gout, history of hypertension, currently under control; low magnesium, severe alcohol withdrawal with delirium tremens, now resolved; gastric reflux symptoms,  diarrhea of unknown etiology, now resolved. AXIS IV:  Severe from job loss and social stress from drinking. AXIS V:  Functioning at time of discharge 50.  ____________________________ Audery Amel, MD jtc:dmm D: 07/01/2013 13:01:54 ET T: 07/01/2013 13:10:00 ET JOB#: [id number]  cc: Audery Amel, MD, <Dictator> Audery Amel MD ELECTRONICALLY SIGNED 07/01/2013 16:38

## 2014-12-02 NOTE — Consult Note (Signed)
PATIENT NAME:  Miguel Soto, Miguel Soto MR#:  [id number] DATE OF BIRTH:  1966/06/19  DATE OF CONSULTATION:  07/24/2013  REFERRING PHYSICIAN:  Elta Guadeloupe R. Jacqualine Code, MD CONSULTING PHYSICIAN:  Nnamdi Dacus B. Silvia Markuson, MD  REASON FOR CONSULTATION: To evaluate a patient with alcoholism.   IDENTIFYING DATA: Miguel Soto is a 49 year old alcoholic.   CHIEF COMPLAINT: "I want to go."   HISTORY OF PRESENT ILLNESS: Miguel Soto was just hospitalized at [facility] in November 7628 following complicated alcohol detox. The patient was taken by his wife to [facility] where he stayed for 2 weeks. Per patient report, his insurance [place] would not approve longer treatment. Per his wife, he left even though 5 more days were approved. Unknown to his wife, he relapsed on alcohol immediately and started drinking at least 2 pints of vodka every day. On the day of admission, he started behaving strangely, was unsteady on his feet, confused, arguing with his family, and the wife in desperation brought him back to the hospital. He was started on CIWA protocol in the Emergency Room. The patient is not interested in substance abuse treatment and does not want to be admitted to the hospital or to substance abuse treatment facility. He does not want to be in a lock-up. He thinks that he will go to [facility] and reports that he has a sponsor, whom he has never met before. He is unaware that his wife is about to take a 50B and does not want him to return home. He denies any symptoms of depression, anxiety, or psychosis. Denies symptoms suggestive of bipolar mania. He denies, other than alcohol, substance use.   PAST PSYCHIATRIC HISTORY: He was in detox in the past, participated in intensive outpatient program and recently residential treatment in [place]. He has a history of blackouts and complicated delirium in November. He was prescribed Lexapro for depression, but the patient denies any depressive symptoms  and does not think that antidepressant has been helpful. He has been drinking since 2001 and had some periods of sobriety for several months at a time.   FAMILY PSYCHIATRIC HISTORY: Other family members with alcoholism.   PAST MEDICAL HISTORY: Gout.   ALLERGIES: AZITHROMYCIN.   MEDICATIONS ON ADMISSION: Indomethacin as needed, Lexapro 10 mg daily, trazodone 100 mg at night, allopurinol 300 mg daily colchicine 0.6 mg daily. He is currently on CIWA protocol.   SOCIAL HISTORY: He is married, has 2 children, one 67 years old and another teenager. The wife has been supportive, but feels that the patient may not reside with his family if he continues to drink and is ready to take 50B. The patient believes that he lost his job on November 11 when he was admitted to psychiatry for detox. According to the wife, he is on short-term disability now and is still employed and insured. The wife will start looking into possibility of further treatment and will discuss it with his insurer.   REVIEW OF SYSTEMS: CONSTITUTIONAL: No fevers or chills. No weight changes. Positive for fatigue.  EYES: No double or blurred vision.  ENT: No hearing loss.  RESPIRATORY: No shortness of breath or cough.  CARDIOVASCULAR: No chest pain or orthopnea.  GASTROINTESTINAL: No abdominal pain, nausea, vomiting, or diarrhea.  GENITOURINARY: No incontinence or frequency.  ENDOCRINE: No heat or cold intolerance.  LYMPHATIC: No anemia or easy bruising.  INTEGUMENTARY: No acne or rash.  MUSCULOSKELETAL: Positive for gout.  NEUROLOGIC: No tingling or weakness.  PSYCHIATRIC: See history of  present illness for details.   PHYSICAL EXAMINATION: VITAL SIGNS: Blood pressure 160/70, pulse 77, respirations 20, temperature 99.3.  GENERAL: This is a well-developed male in no acute distress.   The rest of the physical examination is deferred to his primary attending.   LABORATORY DATA: Chemistries within normal limits with blood glucose  of 114. Blood alcohol level on admission 0.348. Ammonia 30. LFTs are within normal limits except for alkaline phosphatase of 162 and AST of 58. CBC within normal limits except for white blood count of 12.1. Serum acetaminophen and salicylates are low.   MENTAL STATUS EXAMINATION: The patient is alert and oriented to person, place, and somewhat to situation. He is pleasant, polite, and cooperative. He is dishonest, does not admit to drinking. Does not believe that his wife no longer will put up with him. Does not believe that he is still employed or has insurance. He intends to leave the hospital and go back home. He maintains good eye contact. His grooming is adequate. He is wearing hospital scrubs. His mood is fine with irritable affect. Thought process is logical with his own logic. He denies suicidal or homicidal ideations. There are no delusions or paranoia. There are no auditory or visual hallucinations. His cognition seems grossly intact, although initially he was confused. His insight and judgment are extremely poor.   DIAGNOSES: AXIS I: Alcohol dependence.  AXIS II: Deferred.  AXIS III: Gout, hypertension.  AXIS IV: Substance abuse, marital conflict, occupational.  AXIS V: Global Assessment of Functioning 35.   PLAN:  1.  The patient will be referred to [place] if indeed he has no health insurance. His wife is in charge of contacting her current insurer, [employer], to see what would their recommendation be.  2.  The patient is on CIWA protocol and obtained 4 mg of Ativan this morning. We will continue CIWA.  3.  The wife wants to take 50B. Should she go on with the restraining order, will try to convince the patient to accept substance abuse treatment. So far, he is refusing. I will consider putting him on involuntary psychiatric commitment for substance abuse treatment.  ____________________________ Wardell Honour. Bary Leriche, MD jbp:jcm D: 07/24/2013 16:22:01 ET T: 07/24/2013 17:51:00  ET JOB#: [id number]  cc: Liesel Peckenpaugh B. Bary Leriche, MD, <Dictator> Clovis Fredrickson MD ELECTRONICALLY SIGNED 08/10/2013 4:23

## 2014-12-02 NOTE — Consult Note (Signed)
PATIENT NAME:  Miguel SaucierYEATTS, Miguel Soto MR#:  [id number] DATE OF BIRTH:  05-Dec-1965  DATE OF CONSULTATION:  06/26/2013  CONSULTING PHYSICIAN:  Hattie Pine P. Juliene PinaMody, MD  REFERRING PHYSICIAN:  Dr. Toni Amendlapacs  REASON FOR REQUEST:  Chest pain.   IMPRESSIONS: 1.  Abdominal cramping. The patient is not having any chest pain.  2.  Diarrhea.  3.  Likely DTs/alcohol withdrawal.  4.  Elevated TSH.  5.  Hypotension.  6.  Tachycardia.  PLAN:  1.  The patient is not complaining of chest pain. His EKG was without any acute changes. In any case, we can order serial troponins.  2.  The patient is likely going through DTs as the etiology of his diarrhea and cramping abdominal pain.  3.  I would hold blood pressure medications due to low blood pressure. It is unclear if patient is actually taking these medications at home.  4.  Would check a T4.  5.  His tachycardia is likely secondary to alcohol withdrawal/DTs. However, would check a BMP, given the diarrhea, to rule out renal failure and dehydration.   HISTORY OF PRESENT ILLNESS:  This is a 49 year old male who was admitted on the 11th to the Psychiatry service for alcohol withdrawal. Hospitalist service was consulted for chest pain. Upon evaluation, patient denies any chest pain. He says he is actually having abdominal cramping pain that started after noon. He is having 4 or 4 loose stools today. He has not been on any recent antibiotics or traveled anywhere. The patient has had a few loose stools today. As mentioned, he denies any kind of chest pain. He says he is just having this crampy abdominal pain.  REVIEW OF SYSTEMS: CONSTITUTIONAL:  No fever, fatigue, weakness, weight loss, weight gain.  EYES: No blurry or double vision, glaucoma or cataracts.  ENT: No ear pain, hearing loss. Positive snoring. No postnasal drip or difficulty swallowing. RESPIRATORY:  No cough, wheezing, hemoptysis, dyspnea.  CARDIOVASCULAR: No chest pain, orthopnea, edema, arrhythmia, dyspnea on  exertion, palpitations, syncope.  GASTROINTESTINAL: No nausea or vomiting. Positive diarrhea. Positive abdominal cramping. No hematemesis, melena, ulcers.  GENITOURINARY:  No dysuria or hematuria.  ENDOCRINE: No polyuria or polydipsia. HEMATOLOGIC/LYMPHATIC:  No anemia or easy bruising.  SKIN: No rash or lesions.  MUSCULOSKELETAL:  No limited activity.  NEUROLOGIC: No history of CVA, TIA, or seizures.  PSYCHIATRIC:  Positive history of depression.   PAST MEDICAL HISTORY:  1.  Gout.  2.  Hypertension.  3.  Alcohol abuse.  4.  Depression.   MEDICATIONS: 1.  Lexapro 10 mg daily. 2.  Allopurinol 300 mg daily.  3.  Norvasc 5 mg daily.  4.  Colchicine.   5.  Hydrochlorothiazide/lisinopril 25/20 daily.   ALLERGIES:  ERYTHROMYCIN.   SOCIAL HISTORY: The patient drinks 1 pint of vodka a day for over 20 years. No tobacco or IV drug use. He is married and has 2 children.   SURGICAL HISTORY: He had some kind of penile surgery and wisdom teeth.   PHYSICAL EXAMINATION: VITAL SIGNS: Temperature 98.2, pulse is 98 to 127, blood pressure is 72 to 106/55 to 72, 92% on room air.  GENERAL: The patient is alert, oriented, does not appear to be in acute distress.  HEENT: Head is atraumatic. Pupils are round and reactive. Sclerae are anicteric. Mucous membranes are dry. Oropharynx is clear. NECK: Supple, without JVD, carotid bruit, enlarged thyroid.  CARDIOVASCULAR:  Tachycardia, without murmur, gallops, rubs. PMI is not displaced. No chest wall tenderness.  LUNGS: Clear  to auscultation, without crackles, rales, rhonchi, or wheezing. Normal percussion. Normal chest expansion.  ABDOMEN:  Bowel sounds are positive. Nontender, nondistended. No hepatosplenomegaly. No ecchymosis. No guarding or rebound.  EXTREMITIES: No clubbing, cyanosis, edema.  NEUROLOGIC: Cranial nerves II through XII are intact. There are no focal deficits. Cerebellar exam is normal. There is very mild tremor.  SKIN: Without rash or  lesions.   LABORATORIES:  TSH is 5.13.   EKG:  Sinus tachycardia. No ST elevation or depression.   Thank you for allowing [place] to participate in the care of this patient. We will continue to follow.   TIME SPENT: Approximately 55 minutes.      ____________________________ Janyth Contes. Juliene Pina, MD spm:mr D: 06/26/2013 17:59:35 ET T: 06/26/2013 18:47:12 ET JOB#: [id number]  cc: Kelley Knoth P. Juliene Pina, MD, <Dictator> Audery Amel, MD  Janyth Contes Lucresia Simic MD ELECTRONICALLY SIGNED 06/26/2013 20:29

## 2014-12-02 NOTE — Consult Note (Signed)
Erythromycin: Rash   Impression abdominal pain no chest pain with cramping adn diarrhea likley Dts/Eyoh withdrawal elevated tsh hypotension   Plan 1. pt not c/o chest pain EKG without acute changes 2. pt likley going thorugh DTs 3  diarrhea likley from EtOh w/d check stool cx 4. hold BP meds 5. check t4   [id number]   Electronic Signatures: Adrian SaranMody, Royetta Probus (MD)  (Signed [phone]-Nov-14 17:54)  Authored: Allergies, Impression/Plan   Last Updated: 15-Nov-14 17:54 by Adrian SaranMody, Anasofia Micallef (MD)

## 2014-12-02 NOTE — Consult Note (Signed)
Brief Consult Note: Diagnosis: Alcohol dependence.   Patient was seen by consultant.   Consult note dictated.   Recommend further assessment or treatment.   Orders entered.   Comments: [name] has a h/o alcoholism. He relapsed on alcohol immediately following discharge from a rehab facility. He has been drinking 2 pints of vodka daily. The wife is taking 50B. He will not be able to return home.   PLAN: 1. Pt is accepted to [facility]. Please discharge as appropriate.  Electronic Signatures: Kristine LineaPucilowska, Elisandra Deshmukh (MD)  (Signed 15-Dec-14 11:51)  Authored: Brief Consult Note   Last Updated: 15-Dec-14 11:51 by Kristine LineaPucilowska, Kati Riggenbach (MD)

## 2014-12-02 NOTE — Discharge Summary (Signed)
PATIENT NAME:  Miguel Soto, Miguel Soto MR#:  [id number] DATE OF BIRTH:  Mar 21, 1966  DATE OF ADMISSION:  03/16/2013 DATE OF DISCHARGE:  03/22/2013   CHIEF COMPLAINT: The patient reported that he had been drinking a lot and needed help with detox and rehab.  HISTORY OF PRESENT ILLNESS: The patient is a 49 year old, married Caucasian male with a long history of alcohol dependence who had no previous admissions of treatment for alcohol dependence. The patient reported he has been drinking quite heavily over the years and was now drinking a fifth of vodka every day. He reported that his family was very upset with him, and he had passed out this past Saturday and brought himself to the Emergency Room for help. He denied history of withdrawal seizures or DTs or delirium. He endorsed symptoms of abdominal cramps, nausea and dizziness when he does not drink.   COURSE OF HOSPITALIZATION: The patient was admitted on the usual precautions and on CIWA protocol for the alcohol detox. He detoxed without complications throughout his hospital stay. He had some symptoms of visual hallucinations where he saw walls move, but this passed quickly. He was also started on Lexapro at 10 mg daily for his mood symptoms. The patient has a significant history of mood disorders and did report being depressed and anxious. The patient responded well to Lexapro and mood gradually improved through his hospital stay. He was cooperative on the unit and participated in the unit activities. He continued to be tremulous through the hospital stay, but significantly improved by the day of his discharge. He was without any suicidal or homicidal ideations on the day of discharge, and he was mentally stable for discharge.   PAST PSYCHIATRIC HISTORY: The patient has never had help for alcohol dependence and has never been in any hospital, has never had any hospitalizations before.   MEDICAL HISTORY: High blood pressure.  ALLERGIES: He denied having any  allergies.   SOCIAL HISTORY: He is married with 2 children. He is employed in [employer].   SUBSTANCE ABUSE HISTORY: Alcohol: A fifth of liquor a day. He denies use of other substances.   MENTAL STATUS EXAMINATION ON DISCHARGE: The patient was alert and oriented to all spheres. His affect was smiling and mood improved. Speech was normal in rate and volume. Denied any auditory or visual hallucinations. Denied any suicidal or homicidal thoughts. Has fair insight and judgment into his condition.   DIAGNOSES:   AXIS I:  1.  Alcohol dependence.  2.  Mood disorder, not otherwise specified. AXIS II: Deferred.  AXIS III: High blood pressure.  AXIS IV: Chronic alcohol abuse and family history of depression. AXIS V: Global Assessment of Functioning: 75.  SUICIDE RISK ASSESSMENT: The patient currently is at low risk for suicide. However, in the long term, he has risk factors such as family history of depression and suicide and alcoholism. The patient has a good prognosis if he adheres to treatment for his alcoholism and also follows up with AA meetings, IOP.   DISPOSITION PLAN: The patient with follow up with substance abuse IOP and with a psychiatrist. He is recommended to follow up with AA meetings. To continue Lexapro at 10 mg daily. To also see a therapist for his mood issues.   ____________________________ Patrick NorthHimabindu Carmon Brigandi, MD hr:jm D: 03/22/2013 12:53:28 ET T: 03/22/2013 13:23:37 ET JOB#: [id number]  cc: Patrick NorthHimabindu Jaquis Picklesimer, MD, <Dictator> Patrick NorthHIMABINDU Fonnie Crookshanks MD ELECTRONICALLY SIGNED 03/30/2013 11:39

## 2014-12-02 NOTE — Consult Note (Signed)
PATIENT NAME:  Miguel Soto, Miguel Soto MR#:  [id number] DATE OF BIRTH:  17-Nov-1965  DATE OF CONSULTATION:  03/16/2013  REFERRING PHYSICIAN:  Francena Hanlyobert Williams, MD  CONSULTING PHYSICIAN:  Ardeen FillersUzma Soto. Garnetta BuddyFaheem, MD  REASON FOR CONSULT: "I cannot go on drinking like this.  I really need to get this under control."   HISTORY OF PRESENT ILLNESS: The patient is a [age] year old married, white Soto who presented to the ER asking for help with drinking. The patient reported that he has been drinking since 1990. He reported that he initially started drinking a 12-pack of beer throughout the week; however, it escalated throughout the years, and then he started drinking vodka as the beer was affecting his joints, and he started having gout with the beer. The patient reported that he feels that his coping mechanisms were becoming less, and then he started escalating and was drinking more alcohol. He stated that he started shaking and trembling during the daytime. He has to consume more alcohol during the work day as well and has to consume alcohol during the lunch break. The patient stated that he has never been to a rehab program or to a detox program. On Saturday, he consumed one-fifth of the pint and then he passed out during the midday. His wife and his son were very upset with him as he was not functional and was not participating in activities of the family. His 986 year old son was really very upset with him, and he felt guilty about the same. The patient reported that he is willing to be detoxed and then look for the IOP program after that. He reported that he is willing to start using medications to help with the withdrawal symptoms as he really started going into severe withdrawals, and then he is unable to control his symptoms. The patient reported that he is not having any thoughts to harm himself, but he feels very anxious when he starts going into withdrawal symptoms, and he starts feeling shaky, tremulous, nausea with  abdominal cramps.   PAST PSYCHIATRIC HISTORY: The patient reported that he has never seen a psychiatrist and does not take any psychotropic medications. He reported that he feels depressed and anxious at times.   FAMILY HISTORY: The patient reported that his father was alcoholic, and he committed suicide at the age of [age] years old. He reported that he went to [facility] in [place], [place]. He shot himself in the abdomen as he was stressed out due to work-related problems.   MEDICAL HISTORY: The patient reported that he has been diagnosed with hypertension, and he is taking medications for the same. He also felt that he has some bleeding from his lower GI, but his primary care physician told him that it is only related to the hemorrhoids.   CURRENT MEDICATIONS: Hydrochlorothiazide/lisinopril 25/20 mg daily, amlodipine 5 mg daily.   ALLERGIES: ERYTHROMYCIN.    SOCIAL HISTORY: The patient reported that he has been married for the past 20 years. He has 2 children, a daughter age [id number] years old and a son [age] years old. He reported that he is currently employed in [employer], and lost his job 2 years ago, but he regained his job. His wife is very supportive, and she wants him to go to a rehab program.    CLINICAL SUMMARY: VITAL SIGNS: Temperature 98.2, pulse 120, respirations 18, blood pressure 145/88.   REVIEW OF SYSTEMS:   CONSTITUTIONAL: Denies any fever or chills. No weight changes.  EYES: No double or blurred  vision.  RESPIRATORY: No shortness of breath or cough.  CARDIOVASCULAR: Denies any chest pain or orthopnea.  GASTROINTESTINAL: No abdominal pain, nausea, vomiting, diarrhea.  GENITOURINARY: No incontinence or frequency.  ENDOCRINE: No heat or cold intolerance.  LYMPHATIC: No anemia or easy bruising.  INTEGUMENTARY: No acne or rash.  MUSCULOSKELETAL: No muscle or joint pain.  NEUROLOGIC: No tingling or weakness.   MENTAL STATUS EXAMINATION: The patient is a moderately-built Soto who  appeared flushed during the interview. His eye contact was fair. His mood was anxious. Affect was congruent. Thought process was logical, goal-directed. Thought content was nondelusional. He currently denied having any suicidal ideation or plan. He appears somewhat apprehensive.   LABORATORY DATA: Glucose 124, BUN 8, creatinine 0.2, sodium 137, potassium 3.8, chloride 101, bicarbonate 24, anion gap 12, osmolality 274. Blood alcohol at the time of presentation was 295, and now it is 109. Protein 8.4, bilirubin 1.1, alkaline phosphatase 127,  AST 137, ALT 57. TSH 2.95. Urine drug screen was negative. WBC 9.5, hemoglobin 15.2, hematocrit 44, platelet count 81.   DIAGNOSTIC IMPRESSION: AXIS I: 1.  Alcohol dependence.  2.  Major depressive disorder.   AXIS II: None.   AXIS III: Hypertension.   TREATMENT PLAN: 1.  The patient will be admitted to the inpatient behavioral health unit.  2.  He will be started on Librium p.o. q. 4 hours.  3.  He will be started on thiamine 100 mg p.o. b.i.d.  4.  The patient agreed to a trial of Lexapro to help with his depressive and anxiety symptoms.   5. Treatment team to follow.   Thank you for allowing me to participate in the care of this patient.   ____________________________ Ardeen Fillers. Garnetta Buddy, MD usf:cb D: 03/16/2013 15:50:00 ET T: 03/16/2013 16:47:38 ET JOB#: [id number]  cc: Ardeen Fillers. Garnetta Buddy, MD, <Dictator> Rhunette Croft MD ELECTRONICALLY SIGNED 03/25/2013 13:07

## 2014-12-02 NOTE — Consult Note (Signed)
PATIENT NAME:  Miguel Soto, [name] MR#:  [id number] DATE OF BIRTH:  Aug 01, 1966  DATE OF CONSULTATION:  03/16/2013  CONSULTING PHYSICIAN:  Jerid Catherman S. Garnetta BuddyFaheem, MD  REQUESTED BY:  Dr. Francena Hanlyobert Williams   REASON FOR CONSULT: "I cannot go on drinking like this, I really need to get this under control."   HISTORY OF PRESENT ILLNESS: The patient is a 49 year old, married white male, who presented to the ER asking for help with drinking. The patient reported that he has been drinking since 1990. He reported that he initially started drinking a 12-pack of beer all throughout the week. However, it escalated throughout the years, and then he started drinking vodka, as the beer was affecting his joints and he started having gout with the beer. The patient reported that he feels that his coping mechanisms are becoming less, and then he started escalating and was drinking more alcohol. He stated that he started shaking and trembling during the daytime. He has to consume more alcohol during the work day as well, and has to consume alcohol during the lunch break. The patient stated that he has never been to a rehab program or to a detox program. On Saturday, he consumed one-fifth of a pint, and then he passed out during the midday. His wife and his son were very upset with him, as he was not functional and was not participating in activities of the family. His 51 year old son was really very upset with him, and he felt guilty about the same. The patient reported that he is willing to be detoxed and then look for the IUP program after that. He reported that he is willing to start using medications to help with the withdrawal symptoms, as he really started going into severe withdrawals, and then he is unable to control his symptoms. The patient reported that he is not having any thoughts to harm himself, but he feels very anxious when he starts going into withdrawal symptoms, and he starts feeling shaky, tremulous, nausea, with  abdominal cramps.  PAST PSYCHIATRIC HISTORY:  The patient reported that he has never seen a psychiatrist and does not take any psychotropic medications. He reported that he feels depressed and anxious at times.   FAMILY HISTORY:  The patient reported that his father was alcoholic, and he committed suicide at the age of 49 years old. He reported that he went to [facility] in [place], [place]. He shot himself in the abdomen, as he was stressed out due to work-related problems.   MEDICAL HISTORY: The patient reported that he has been diagnosed with hypertension, and he is taking medications for the same. He also felt that he has some bleeding from his lower GI, but his primary care physician told him that it is only related to the hemorrhoids.   CURRENT MEDICATIONS: Hydrochlorothiazide/lisinopril 25/20 mg daily, amlodipine 5 mg daily.   ALLERGIES:  ERYTHROMYCIN.  SOCIAL HISTORY:  The patient reported that he has been married for the past 20 years. He has 2 children, a daughter age 730 years old and a son 51 years old. He reported that he is currently employed in [employer], and lost his job 2 years ago, but he regained his job. His wife is very supportive, and she wants him to go to a rehab program.   VITAL SIGNS: Temperature 98.2, pulse 120, respirations 18, blood pressure 145/88.   REVIEW OF SYSTEMS:   CONSTITUTIONAL:  Denies any fever or chills. No weight changes.  EYES:  No double or  blurred vision.  RESPIRATORY:  No shortness of breath or cough.  CARDIOVASCULAR:  Denies any chest pain or orthopnea.  GASTROINTESTINAL:  No abdominal pain, nausea, vomiting, diarrhea.  GENITOURINARY:  No incontinence or frequency.  ENDOCRINE:  No heat or cold intolerance.  LYMPHATIC:  No anemia or easy bruising.  INTEGUMENTARY:  No acne or rash.  MUSCULOSKELETAL:  No muscle or joint pain.  NEUROLOGIC:  No tingling or weakness.   MENTAL STATUS EXAMINATION: The patient is a moderately-built male who appeared  flushed during the interview. His eye contact was fair. His mood was anxious. Affect was congruent. Thought process was logical, goal-directed. Thought content was non-delusional. He currently denied having any suicidal ideation or plan. He appeared somewhat apprehensive.   LABS: Glucose 124, BUN 8, creatinine 0.2, sodium 137, potassium 3.8, chloride 101, bicarbonate 24, anion gap 12, osmolality 274. Blood alcohol level at the time of presentation was 295, and now it is 109. Protein 8.4, bilirubin 1.1, alkaline phosphatase 127, AST 137, ALT 57. TSH 2.95. Urine drug screen was negative. WBC 9.5, hemoglobin 15.2, hematocrit 44, platelet count 81.   DIAGNOSTIC IMPRESSION: AXIS I: 1.  Alcohol dependence.  2.  Major depressive disorder,   AXIS II:  None.   AXIS III:  Hypertension.   TREATMENT PLAN: 1.  The patient will be admitted to the inpatient behavioral health unit.  2.  He will be started on Librium   p.o. q. 4 hours.  3.  He will be started on thiamine 100 mg p.o. b.i.d.  4.  The patient agreed to a trial of Lexapro to help with his depressive anxiety symptoms.   Treatment team to follow.   Thank you for allowing me to participate in the care of this patient.    ____________________________ Ardeen Fillers. Garnetta Buddy, MD usf:mr D: 03/16/2013 15:50:42 ET T: 03/16/2013 18:54:52 ET JOB#: [id number]  cc: Ardeen Fillers. Garnetta Buddy, MD, <Dictator> Rhunette Croft MD ELECTRONICALLY SIGNED 03/25/2013 13:07

## 2014-12-03 NOTE — Consult Note (Signed)
Brief Consult Note: Diagnosis: Alcohol dependence.   Patient was seen by consultant.   Consult note dictated.   Recommend further assessment or treatment.   Orders entered.   Comments: [name] has a h/o alcoholism. He relapsed on alcohol immediately following discharge from a rehab facility after 28 days.   PLAN: 1. The patient does not meet criteria for IVC. I will terminate proceedings. Please discharge as appropriate.   2. No medications recommended.  3. He does not need detox, discharged from [facility] yesterday.   4. He is homeless. He will not be able to return home..  Electronic Signatures: Kristine LineaPucilowska, Palak Tercero (MD)  (Signed 30-Jan-15 13:35)  Authored: Brief Consult Note   Last Updated: 30-Jan-15 13:35 by Kristine LineaPucilowska, Cote Mayabb (MD)

## 2014-12-03 NOTE — Discharge Summary (Signed)
PATIENT NAME:  Miguel Soto, Miguel Soto MR#:  [id number] DATE OF BIRTH:  11/01/1965  DATE OF ADMISSION:  12/28/2013 DATE OF DISCHARGE:  01/08/2014  Please take a look at the detailed interim discharge summary done by Dr. Winona Legato which covers hospital course from May 19th until May 25th. This is a hospital course from May 26th until May 30th.   DIET: The patient is being discharged on a low-sodium diet.   ACTIVITY: As tolerated.   FOLLOWUP: With Dr. Dow Adolph from gastroenterology in the next 1-2 weeks. Also follow up with Dr. Barry Brunner in the next 1-2 weeks.   DISCHARGE DIAGNOSES: Are as follows:  1. Acute alcoholic hepatitis.  2. Acute pancreatitis.  3. Delirium tremens with acute alcohol withdrawal, now resolved.  4. Acute renal failure.  5. Anemia and thrombocytopenia due to chronic liver disease.  6. Coagulopathy secondary to liver disease.  7. Generalized weakness.   DISCHARGE MEDICATIONS: As follows: Prednisolone 13.3 mL daily, folic acid 1 mg daily, thiamine 100 mg 3 tabs daily, lactulose 30 mL b.i.d., potassium 20 mEq b.i.d. for 5 days.   DISPOSITION: The patient is being discharged with home health, physical therapy, nursing, and nursing aide services.   CONSULTANTS DURING THE HOSPITAL COURSE: Dr. Dow Adolph from gastroenterology, Dr. Mady Haagensen and Dr. Mosetta Pigeon from nephrology.   PERTINENT STUDIES DONE DURING THE INTERIM HOSPITAL COURSE: None.   HOSPITAL COURSE: This is a 49 year old male with multiple medical problems as mentioned above, who presented to the hospital due to being found jaundiced and noted to be confused and encephalopathic secondary to hepatic encephalopathy.  1. Acute alcoholic hepatitis. This was secondary to heavy alcohol abuse which the patient has a history of. The patient's discriminant factor was elevated; therefore was started on oral prednisone. Has been maintained on oral prednisolone. The patient was seen by gastroenterology and they did  not recommend any further intervention other than continuing his prednisolone and him abstaining from alcohol. His total bilirubins and his LFTs are still elevated and they likely will be persisting elevated for weeks as per GI and it can be further followed as an outpatient. Dr. Shelle Iron is to follow up the patient in the next 1 to 2 weeks.  2. Acute pancreatitis. The patient had a nonspecific lipase elevation during the hospital course. Although he had no abdominal pain, no nausea, no vomiting. His lipase level did trend down. He actually did not have any radiologic evidence of acute pancreatitis, and he has no clinical symptoms presently.  3. Hepatic encephalopathy. This has significantly improved since admission. The patient still has a slight tremor. His ammonia level has come down nicely. It is at 48 on the day of discharge. He was on Xifaxan and lactulose in the hospital, although his lactulose was stopped, as he had developed some C. difficile and diarrhea, but his C. difficile and diarrhea have now resolved; therefore, his lactulose is being resumed upon discharge.  4. History of delirium tremens. This was secondary to his heavy alcohol abuse so he went through alcohol withdrawal. It has improved and resolved since admission. For now, he will continue thiamine and folate supplements. 5. Acute renal failure. This is likely acute tubular necrosis secondary to volume depletion as per nephrology. The patient was receiving intermittent IV fluids and albumin infusions. His renal function has remained stable. He likely needs outpatient nephrology followup which is to be arranged through his primary care physician as an outpatient.  6. Acidosis. This was a non anion  gap acidosis. This was likely secondary to his mild renal failure versus D-lactic acidosis from his liver failure. He was given sodium bicarbonate supplements and his acidosis has improved.  7. Anemia and thrombocytopenia. This was secondary to  chronic liver disease and further needs to be followed as an outpatient.  8. Diarrhea. This was initially secondary to C. difficile. It has been treated with Flagyl. His diarrhea has now resolved. There is no acute issue related to this.  9. Coagulopathy. This is secondary to his chronic liver disease. The patient did receive some vitamin K. His INR is still elevated due to his significant liver disease but he has no evidence of acute bleeding.  10. Hypokalemia. This is improving with oral supplementation and further needs to be followed. I will be discharging the patient on some oral potassium supplements.  DISPOSITION: The patient is being discharged home with home health services. The patient's wife and family are sincerely concerned about the patient being discharged home as they do not have somebody to watch over him while they are at work. The patient has ambulated well with physical therapy. He does not qualify to be discharged to a skilled nursing facility. This has been explained to the family multiple times. The patient has been arranged for home health services, which is where he is presently going to be discharged.   TIME SPENT WITH THE DISCHARGE: 45 minutes.   ____________________________ Rolly PancakeVivek J. Cherlynn KaiserSainani, MD vjs:lt D: 01/08/2014 14:50:13 ET T: 01/09/2014 02:03:22 ET JOB#: [id number]  cc: Rolly PancakeVivek J. Cherlynn KaiserSainani, MD, <Dictator> Dow AdolphMatthew Rein, MD Jorje GuildGlenn R. Beckey DowningWillett, MD Houston SirenVIVEK J Keylah Darwish MD ELECTRONICALLY SIGNED 01/15/2014 21:51

## 2014-12-03 NOTE — Consult Note (Signed)
Brief Consult Note: Diagnosis: alcohol induced hepatitis, AMS.   Patient was seen by consultant.   Consult note dictated.   Orders entered.   Discussed with Attending MD.   Comments: Patient seen and evaluated. Patient is clinically jaundiced and extremely confused. Appears jittery. Unable to obtain history from patient due to altered mentation. Maddrey's Discriminant Function score is >32, suggesting alcohol-induced hepatitis. Also, he is encephalopathic. Will start patient on Lactulose 30ml Q4.  He will need to be on Prednisolone 40mg  qdaily x28 days, followed by a 16 day taper.  Monitor vitals and labs closely. Remain NPO. Prognosis guarded.  Full consult being dictated.  Electronic Signatures: Brantley StageEarle, Ewing Fandino M (PA-C)  (Signed 19-May-15 13:11)  Authored: Brief Consult Note   Last Updated: 19-May-15 13:11 by Ashok CordiaEarle, Louanne Calvillo M (PA-C)

## 2014-12-03 NOTE — Consult Note (Signed)
Chief Complaint:  Subjective/Chief Complaint Pt denies any pain.  2 loose BMs x 24 hrs.  Denies rectal bleeding or melena.  Denies N/V.  AFP normal.   VITAL SIGNS/ANCILLARY NOTES: **Vital Signs.:   23-Jun-15 04:52  Vital Signs Type Routine  Temperature Temperature (F) 97.1  Celsius 36.1  Pulse Pulse 81  Respirations Respirations 18  Systolic BP Systolic BP 003  Diastolic BP (mmHg) Diastolic BP (mmHg) 64  Mean BP 80  Pulse Ox % Pulse Ox % 100  Pulse Ox Activity Level  At rest  Oxygen Delivery Room Air/ 21 %   Brief Assessment:  GEN no acute distress, Chronically ill, jaundiced, A/Ox3   Cardiac Regular   Respiratory normal resp effort   Gastrointestinal details normal Soft  Bowel sounds normal  No rebound tenderness  No gaurding  +mild distention, Liver 6FB below RCM, +splenomegaly   EXTR positive edema, Trace pretibial edemea bilat, +asterixis   Additional Physical Exam HEENT: +scleral icterus   Lab Results:  Hepatic:  23-Jun-15 04:36   Bilirubin, Direct  24.00 (Result(s) reported on 01 Feb 2014 at 05:36AM.)  Bilirubin, Total  31.4  Alkaline Phosphatase  654 (45-117 NOTE: New Reference Range 07/02/13)  SGPT (ALT)  112  SGOT (AST)  214  Total Protein, Serum  4.2  Albumin, Serum  2.2  Routine Chem:  23-Jun-15 04:36   Ammonia, Plasma < 10 (Result(s) reported on 01 Feb 2014 at 05:39AM.)  Glucose, Serum 85  Creatinine (comp)  4.25  Sodium, Serum  135  Potassium, Serum  3.3  Chloride, Serum 103  CO2, Serum  16  Calcium (Total), Serum  7.9  Osmolality (calc) 295  eGFR (African American)  18  eGFR (Non-African American)  15 (eGFR values <40m/min/1.73 m2 may be an indication of chronic kidney disease (CKD). Calculated eGFR is useful in patients with stable renal function. The eGFR calculation will not be reliable in acutely ill patients when serum creatinine is changing rapidly. It is not useful in  patients on dialysis. The eGFR calculation may not be  applicable to patients at the low and high extremes of body sizes, pregnant women, and vegetarians.)  Anion Gap 16  Routine Coag:  23-Jun-15 04:36   Prothrombin  21.6  INR 1.9 (INR reference interval applies to patients on anticoagulant therapy. A single INR therapeutic range for coumarins is not optimal for all indications; however, the suggested range for most indications is 2.0 - 3.0. Exceptions to the INR Reference Range may include: Prosthetic heart valves, acute myocardial infarction, prevention of myocardial infarction, and combinations of aspirin and anticoagulant. The need for a higher or lower target INR must be assessed individually. Reference: The Pharmacology and Management of the Vitamin K  antagonists: the seventh ACCP Conference on Antithrombotic and Thrombolytic Therapy. CBCWUG.8916Sept:126 (3suppl): 2N9146842 A HCT value >55% may artifactually increase the PT.  In one study,  the increase was an average of 25%. Reference:  "Effect on Routine and Special Coagulation Testing Values of Citrate Anticoagulant Adjustment in Patients with High HCT Values." American Journal of Clinical Pathology 2006;126:400-405.)  Routine Hem:  23-Jun-15 04:36   WBC (CBC)  28.4  RBC (CBC)  3.01  Hemoglobin (CBC)  10.4  Hematocrit (CBC)  30.8  Platelet Count (CBC)  128  MCV  102  MCH  34.6  MCHC 33.9  RDW  17.5  Neutrophil % 89.6  Lymphocyte % 7.6  Monocyte % 2.5  Eosinophil % 0.2  Basophil % 0.1  Neutrophil #  25.5  Lymphocyte # 2.2  Monocyte # 0.7  Eosinophil # 0.0  Basophil # 0.0 (Result(s) reported on 01 Feb 2014 at 05:54AM.)   Assessment/Plan:  Assessment/Plan:  Assessment ETOH hepatitis: He has significant cholestasis & jaundice.  On prednisolone since May admission under care of Dr Rayann Heman.  Ultrasound pending.  Hisw condition remains guarded.  Creatinine worse. Melena/hemoccult positive: EGD as soon as possible to r/o esophageal varices or oozing gastropathy.  Anemia:  Hgb declined to 10.4 Diarrhea: await stool studies.  May be due to lactulose outpatient. for at least the past week & stool studies wil lbe ordered to r/o infectious colitis. Discussed his care with Dr Darvin Neighbours & will discuss with Dr Allen Norris   Plan 1) Follow up Ultrasound 2) Daily LFTS, INR & CBC 3) Follow up c diff PCR, stool culture 4) EGD with Dr Allen Norris 5) continue protonix 4m daily IV 6) continue Rocephin 1 gram IV daily for GI bleeding in cirrhotic 7) consider nephrology consult for renal failure Please call if you have any questions or concerns   Electronic Signatures for Addendum Section:  JAndria Meuse(NP) (Signed Addendum 23-Jun-15 16:31)  Discussed EGD findings with Dr WAllen Norris  He discussed pt's condition MELD >40, probable portal vein thrombosis, decompensation with wife & daughter, multiple questions answered.  Hematology consult for probable portal vein thrombosis with non-bleeding esophageal ulcer.  Pt may have anticoagulation given advanced risk of bleeding as benefits outweigh risk at this time.  Will ask hematology for input.  He should remain on BID protonix 449m  JoAndria MeuseNP) (Signed Addendum 23-Jun-15 17:01)  Discussed with Dr PaMa Hillockho agrees to see pt   Electronic Signatures: JoAndria MeuseNP)  (Signed 23-Jun-15 09:30)  Authored: Chief Complaint, VITAL SIGNS/ANCILLARY NOTES, Brief Assessment, Lab Results, Assessment/Plan   Last Updated: 23-Jun-15 17:01 by JoAndria MeuseNP)

## 2014-12-03 NOTE — Consult Note (Signed)
PATIENT NAME:  Miguel Soto, Miguel Soto MR#:  [id number] DATE OF BIRTH:  26-Jun-1966  DATE OF CONSULTATION:  01/31/2014  REFERRING PHYSICIAN:  Gladstone Lighter, MD CONSULTING PHYSICIAN:  Lucilla Lame, MD/Kandice Evalina Field, NP  PRIMARY CARE PHYSICIAN: Dr. Ilene Qua.   PRIMARY GASTROENTEROLOGIST: Dr. Arther Dames.   REASON FOR CONSULTATION: Alcoholic hepatitis.   HISTORY OF PRESENT ILLNESS: Miguel Soto is a 49 year old Caucasian male with a history of chronic alcohol use who was recently diagnosed with alcoholic hepatitis during admission May 19th through May 30th. He has quit drinking since that time. He has been followed by Dr. Arther Dames and his PA, Loren Racer. He was seen in the clinic today with hypotension and sent to the Emergency Room where he was admitted. He had been consuming up to 12 beers a day along with a pint and a half of vodka for the last 3-1/2 years. He had drank heavily for about 30 years per his wife. He has had some bouts of hepatic encephalopathy. Was on lactulose at home last week, and he began to have diarrhea. Therefore, it was changed to Xifaxan. His wife noticed that he was having melena last week on at least 1 or 2 occasions. The diarrhea has slacked off now that he has stopped the lactulose, but he did have a large loose stool this morning. He has never had an EGD or colonoscopy. He has had some nausea but denies any abdominal pain, fever. He has had persistent jaundice since his last hospitalization. He has also had pruritus. He was admitted with renal failure with a creatinine of 4.04. He has a total bilirubin of 29.8, alkaline phosphatase 768, AST 228, ALT 120. He was admitted with a white blood cell count of 37.6 and has been on prednisolone daily for alcoholic hepatitis. His hemoglobin was 11.8 on admission with an MCV of 104. His ammonia was less than 10. His platelet count was 191. His ultrasound last admission showed an abnormal liver, gallbladder sludge. No stones. He had a CT  12/31/2013 which showed extensive hepatic steatosis which could obscure a mass, hepatic ascites, mild proximal colitis and possible trace peritoneal fluid, questionable hemorrhage below the right costal margin. He has been noticing dark urine but not increased urinary frequency. He feels like he has an ulcer on his bottom, and he has been recently on antibiotics for a UTI. He has had fatigue and weakness and generalized deconditioning.   PAST MEDICAL AND SURGICAL HISTORY: Alcoholic hepatitis on prednisolone, history of chronic alcohol use (quit May 2015), recent admission for acute renal failure, hepatic encephalopathy, hypoalbuminemia, gout, penile surgery, eye surgery.   MEDICATIONS PRIOR TO ADMISSION: Bactrim DS 800 ZS/010 mg b.i.d., folic acid 1 mg daily, furosemide 20 mg daily, prednisolone 15 mg/5 mL 13.33 mL daily, rifaximin 550 mg b.i.d., spironolactone 50 mg daily, thiamine 100 mg 3 tablets daily.   ALLERGIES: ERYTHROMYCIN CAUSES RASH.   FAMILY HISTORY: There is no known family history of chronic liver disease or colorectal carcinoma or GI problems.   SOCIAL HISTORY: He is married. He has a history of chewing tobacco. He denies any illicit drug use. He lost his job August 2014 and has been unemployed since that time. He previously built trusses.   REVIEW OF SYSTEMS: See HPI. Otherwise negative 12-point review of systems.    PHYSICAL EXAMINATION:  VITAL SIGNS: Temp 97.5, pulse 79, respirations 18, blood pressure 109/66, O2 sat 100% on room air.  GENERAL: Miguel Soto is a chronically ill-appearing, 49 year old, Caucasian male  who was very jaundiced. His wife was at the bedside.  HEENT: Sclerae with icterus, edema. Oropharynx pink and moist without any lesions.  NECK: Supple without any mass or thyromegaly. He does have JVD.  CHEST: Heart regular rate and rhythm. Normal S1, S2 without murmurs, clicks, rubs or gallops.  LUNGS: With decreased breath sounds bilaterally but no acute distress.   ABDOMEN: Protuberant. He has positive bowel sounds x 4. Abdomen is soft, nontender. Liver is palpable 6 fingerbreadths below the right costal margin. He also has splenomegaly. There is no rebound, tenderness or guarding.  EXTREMITIES: He has 1+ pretibial edema bilaterally. He has asterixis.  SKIN: Jaundiced. He has petechiae, spider angiomas. He has erythema and blanching to his buttocks bilaterally.  RECTAL: Reveals Hemoccult-positive dark brown stool in the vault. No internal masses palpated.  NEUROLOGIC: Grossly intact with tremors and asterixis.  PSYCHIATRIC: He is alert, cooperative. Normal mood and affect.  MUSCULOSKELETAL: Good equal movement and strength bilaterally.   LABORATORY STUDIES: Troponin was negative. Glucose 142, sodium 130, BUN 73, CO2 16, calcium 8.1, otherwise normal met-7. See HPI for other labs.   IMPRESSION: Miguel Soto is a very pleasant 49 year old Caucasian male diagnosed with alcoholic hepatitis and started on prednisolone May 19th through 30th here under the care of Dr. Rayann Heman. He was seen in [facility] today and found to be hypotensive, so he was sent to the Emergency Room. His blood pressure did respond to IV fluids, but he was found to have a creatinine over 4. He has not had any alcohol since admission in May. He has had melena and is Hemoccult positive with a very mild anemia and nausea today. He has never had an EGD and will need this as soon as possible to rule out esophageal varices or oozing gastropathy. His LFTs are significantly elevated in a cholestatic pattern, and he is jaundiced. He will need abdominal ultrasound to re-evaluate for biliary obstruction and portal vein thrombus. He also needs followup imaging to rule out hepatocellular carcinoma given his last abnormal ultrasound. He has a history of encephalopathy but has clear mentation at the moment with normal ammonia. He has had diarrhea for at least the past week, and stool studies will be ordered to  rule out infectious colitis. His condition is guarded.  MELD greater than 40 with overall grave prognosis.  PLAN:  1. Stat hemoglobin, INR and AFP today.  2. Will need daily LFTs, INR and CBC.  3. C. diff PCR and stool culture.  4. Agree with albumin infusion.  5. Ammonia in the morning.  6. Clear liquids. N.p.o. after midnight.  7. Request labs from [place].  8. EGD likely tomorrow with Dr. Allen Norris if he is stable. I have discussed risks and benefits which include, but are not limited to, bleeding, infection, perforation, drug reaction. He agrees with plan, and consent will be obtained.  9. Protonix 40 mg IV daily.  10. Rocephin 1 g IV daily for GI bleeding in cirrhotic 11. Wound care consult for sacral decubitus developing.   Thank you for allowing [place] to participate in the care of this patient.    ____________________________ Andria Meuse, NP klj:[id number] D: 01/31/2014 21:43:26 ET T: 01/31/2014 22:04:43 ET JOB#: [id number]  cc: Andria Meuse, NP, <Dictator> Fonnie Jarvis. Ilene Qua, MD Arther Dames, MD Andria Meuse FNP ELECTRONICALLY SIGNED 02/11/2014 16:49

## 2014-12-03 NOTE — Consult Note (Signed)
Brief Consult Note: Diagnosis: ETOH Hepatitis.   Patient was seen by consultant.   Comments: [name] is a very pleasant 49 y/o caucasian male diagnosed with ETOH hepatitis & started on prednisolone May 19-30 here under care of Dr Shelle Ironein.  [facility] & found to be hypotensive so he was sent to ER.  His BP did respond to IV fluids but he was found to have creatinine over 4.  He has not had any ETOH since admission in May.  He has had melena & is hemoccult positive with a very mild anemia & nausea.   He has never had EGD & will need this as sson as possible to r/o esophageal varices or oozing gastropathy.  His LFTS are significantly elevated in cholestatic pattern & he is jaundiced.  He will need abdominal ultrasound to reevaluate for biliary obstruction & portal vein thrombus.  He also needs [facility] last abnormal [facility].  He has hx of encephalopathy but has clear mentation at the moment with normal ammonia.  He has had diarrhea for at least the past week & stool studies wil lbe ordered to r/o infectious colitis.  His condition is guarded.   Plan: 1) STAT hgb, INR, & AFP today 2) Will need daily LFTS, INR & CBC 3) c diff PCR, stool culture 4) agree with albumin infusion 5) ammonia in AM 6) clear liquids, NPO after MN 7) [facility]  8) EGD likely tomorrow if stable 9) protonix 40mg  daily IV 10) Rocephin 1 gram IV daily for GI bleeding in cirrhotic 11) wound care consult for sacral decubiti developing Thanks for allowing us to participate in his care.  Please see full dictated note. #[id number].  Electronic Signatures: Joselyn ArrowJones, Arlette Schaad L (NP)  (Signed 22-Jun-15 21:43)  Authored: Brief Consult Note   Last Updated: 22-Jun-15 21:43 by Joselyn ArrowJones, Reyonna Haack L (NP)

## 2014-12-03 NOTE — Discharge Summary (Signed)
PATIENT NAME:  Miguel Soto, Miguel Soto MR#:  [id number] DATE OF BIRTH:  August 24, 1965  DATE OF ADMISSION:  01/31/2014 DATE OF DISCHARGE:  02/04/2014  ADMISSION DIAGNOSES:  1.  Acute renal failure.  2.  Acute on chronic alcohol hepatitis.  3.  Hepatic encephalopathy.   DISCHARGE DIAGNOSES: 1.  Acute renal failure.  2.  Acute on chronic alcohol hepatitis.  3.  Hepatic encephalopathy.  4.  Urinary tract infection.  5.  Hypotension.   CONSULTATIONS:  1.  Dr. Cherylann RatelLateef.  2.  Dr. Sherrlyn HockPandit from oncology. 3.  Palliative care Dr. Harvie JuniorPhifer.  4.  Dr. Midge Miniumarren Wohl.   PROCEDURES: Endoscopy February 01, 2014 showed nonbleeding esophageal ulcer, portal hypertensive gastropathy, and duodenitis.   PERTINENT LABORATORY: MELD score 40.   HOSPITAL COURSE: A 49 year old male with history of alcohol hepatitis who presented with acute renal failure and hepatic encephalopathy. For further details, please refer to the H and P.  1.  Acute alcoholic hepatitis. The patient was started on prednisolone. Bilirubin and liver enzymes are still very high in spite of treatment. His MELD score is 40 which suggests very poor prognosis. MRI was not confirmatory for portal vein thrombosis, but no anticoagulation has been recommended. Due to his very poor prognosis, not improving, and after conversation with GI and palliative care wife has decided on hospice home.  2.  Acute renal failure, on chronic kidney disease, likely secondary to ATN and dehydration. Nephrology was consulted. Consult was appreciated. Lasix and Aldactone was then held.  3.  Melena. The patient underwent EGD with esophageal ulcer and portal gastropathy.  4.  Hepatic encephalopathy which is not much improved on lactulose.  5.  Pancytopenia. Due to her alcohol and liver disease.  6.  Coagulopathy. Due to liver disease. Received vitamin K.  Due to his poor prognosis, palliative care was consulted. Wife has decided on hospice home.   DISCHARGE MEDICATIONS: 1.  Morphine 0.5  mL q. 1 to 2 hours p.r.n.  2.  Ativan 0.5 mg q. 2 to 4 hours p.r.n.   DISCHARGE DIET: As tolerated.   DISCHARGE OXYGEN: P.r.n.   DISPOSITION: The patient will be discharged to hospice home. Prognosis is very guarded.   time 31 minutes  ____________________________ Sital P. Juliene PinaMody, MD spm:sb D: 02/04/2014 13:23:45 ET T: 02/04/2014 13:48:07 ET JOB#: [id number]  cc: Sital P. Juliene PinaMody, MD, <Dictator> Janyth ContesSITAL P MODY MD ELECTRONICALLY SIGNED July 27, 2014 11:49

## 2014-12-03 NOTE — Consult Note (Signed)
Chief Complaint:  Subjective/Chief Complaint Miguel Soto with etoh liver disease and MELD of 40. Labs stable except renal function that has not improved as much as hoped for. Now with some encephalopathy.   VITAL SIGNS/ANCILLARY NOTES: **Vital Signs.:   26-Jun-15 04:00  Vital Signs Type Routine  Temperature Temperature (F) 97.9  Celsius 36.6  Pulse Pulse 74  Respirations Respirations 20  Systolic BP Systolic BP 660  Diastolic BP (mmHg) Diastolic BP (mmHg) 67  Mean BP 82  Pulse Ox % Pulse Ox % 100  Pulse Ox Activity Level  At rest  Oxygen Delivery Room Air/ 21 %    08:57  Telemetry pattern Cardiac Rhythm Normal sinus rhythm; Tachycardia   Brief Assessment:  GEN critically ill appearing   Cardiac Regular   Respiratory normal resp effort   Gastrointestinal details normal Soft  distended   Lab Results: Hepatic:  26-Jun-15 04:32   Bilirubin, Total  27.6  Alkaline Phosphatase  442 (45-117 NOTE: New Reference Range 07/02/13)  SGPT (ALT)  80  SGOT (AST)  151  Total Protein, Serum  3.8  Albumin, Serum  2.6  Routine Chem:  26-Jun-15 04:32   Glucose, Serum 70  BUN  58  Creatinine (comp)  3.70  Sodium, Serum 142  Potassium, Serum 4.0  Chloride, Serum  114  CO2, Serum  14  Calcium (Total), Serum  7.5  Osmolality (calc) 298  eGFR (African American)  21  eGFR (Non-African American)  18 (eGFR values <34m/min/1.73 m2 may be an indication of chronic kidney disease (CKD). Calculated eGFR is useful in patients with stable renal function. The eGFR calculation will not be reliable in acutely ill patients when serum creatinine is changing rapidly. It is not useful in  patients on dialysis. The eGFR calculation may not be applicable to patients at the low and high extremes of body sizes, pregnant women, and vegetarians.)  Anion Gap 14  Routine Coag:  26-Jun-15 04:32   Prothrombin  22.1  INR 2.0 (INR reference interval applies to patients on anticoagulant therapy. A single INR  therapeutic range for coumarins is not optimal for all indications; however, the suggested range for most indications is 2.0 - 3.0. Exceptions to the INR Reference Range may include: Prosthetic heart valves, acute myocardial infarction, prevention of myocardial infarction, and combinations of aspirin and anticoagulant. The need for a higher or lower target INR must be assessed individually. Reference: The Pharmacology and Management of the Vitamin K  antagonists: the seventh ACCP Conference on Antithrombotic and Thrombolytic Therapy. CYTKZS.0109Sept:126 (3suppl): 2N9146842 A HCT value >55% may artifactually increase the PT.  In one study,  the increase was an average of 25%. Reference:  "Effect on Routine and Special Coagulation Testing Values of Citrate Anticoagulant Adjustment in Patients with High HCT Values." American Journal of Clinical Pathology 2006;126:400-405.)  Routine Hem:  26-Jun-15 04:32   WBC (CBC)  17.3  RBC (CBC)  2.37  Hemoglobin (CBC)  8.3  Hematocrit (CBC)  24.6  Platelet Count (CBC)  73  MCV  104  MCH  35.2  MCHC 33.9  RDW  17.7  Neutrophil % 89.5  Lymphocyte % 7.6  Monocyte % 1.9  Eosinophil % 0.9  Basophil % 0.1  Neutrophil #  15.5  Lymphocyte # 1.3  Monocyte # 0.3  Eosinophil # 0.2  Basophil # 0.0 (Result(s) reported on 04 Feb 2014 at 05:24AM.)   Assessment/Plan:  Assessment/Plan:  Assessment Etoh Liver disease.   Plan Patient with poor prognosis with a MELD score  of 40. Supportive care to be continued.   Electronic Signatures: Lucilla Lame (MD)  (Signed 26-Jun-15 13:04)  Authored: Chief Complaint, VITAL SIGNS/ANCILLARY NOTES, Brief Assessment, Lab Results, Assessment/Plan   Last Updated: 26-Jun-15 13:04 by Lucilla Lame (MD)

## 2014-12-03 NOTE — Consult Note (Signed)
Chief Complaint:  Subjective/Chief Complaint Pt denies abdominal pain, nausea or vomiting.  More confused this AM.  No BM yet today.   VITAL SIGNS/ANCILLARY NOTES: **Vital Signs.:   25-Jun-15 04:45  Vital Signs Type Routine  Temperature Temperature (F) 97.5  Celsius 36.3  Temperature Source oral  Pulse Pulse 80  Respirations Respirations 20  Systolic BP Systolic BP 716  Diastolic BP (mmHg) Diastolic BP (mmHg) 69  Mean BP 82  Pulse Ox % Pulse Ox % 99  Pulse Ox Activity Level  At rest  Oxygen Delivery Room Air/ 21 %   Brief Assessment:  GEN no acute distress, Chronically ill, jaundiced, A/Ox1, confused,  sister-in-law at bedside   Cardiac Regular   Respiratory normal resp effort   Gastrointestinal details normal Soft  Bowel sounds normal  No rebound tenderness  No gaurding  +mild distention, Liver 6FB below RCM, +splenomegaly   EXTR positive edema, Trace pretibial edemea bilat, +asterixis   Additional Physical Exam HEENT: +scleral icterus   Lab Results: Hepatic:  25-Jun-15 05:25   Bilirubin, Total  26.1  Alkaline Phosphatase  477 (45-117 NOTE: New Reference Range 07/02/13)  SGPT (ALT)  89  SGOT (AST)  181  Total Protein, Serum  4.6  Albumin, Serum  3.3  Routine Chem:  25-Jun-15 05:25   Glucose, Serum 67  BUN  66  Sodium, Serum 136  Potassium, Serum  3.4  Chloride, Serum 107  CO2, Serum  15  Calcium (Total), Serum  7.2  Osmolality (calc) 289  eGFR (African American)  22  eGFR (Non-African American)  19 (eGFR values <72m/min/1.73 m2 may be an indication of chronic kidney disease (CKD). Calculated eGFR is useful in patients with stable renal function. The eGFR calculation will not be reliable in acutely ill patients when serum creatinine is changing rapidly. It is not useful in  patients on dialysis. The eGFR calculation may not be applicable to patients at the low and high extremes of body sizes, pregnant women, and vegetarians.)  Anion Gap 14    Radiology Results: MRI:    24-Jun-15 11:05, MRI Abdomen Without Contrast  MRI Abdomen Without Contrast   REASON FOR EXAM:    Suspicion of Portal vein thrombus on ultrasound, has   cirrhosis and portal hypert  COMMENTS:       PROCEDURE: MR  - MR ABDOMEN WO CONTRAST  - Feb 02 2014 11:05AM     CLINICAL DATA:  Cirrhosis with jaundice. Question of portal vein  thrombosis on recent ultrasound.    EXAM:  MRI ABDOMEN WITHOUT CONTRAST    TECHNIQUE:  Multiplanar multisequence MR imaging was performed without the  administration of intravenous contrast.  COMPARISON:  Ultrasound exam from 02/01/2014. CT scan from  12/31/2013.    FINDINGS:  Lung Bases: Unremarkable.    Liver: Irregular contour compatible with the reported history of  cirrhosis. No focal intraparenchymal mass lesion is evident on this  uninfused study.    Spleen: Normal.  No evidence for splenomegaly.    Stomach: Unremarkable.    Pancreas: Not well seen, but no focal mass lesion or evidence of  main duct dilatation.    Gallbladder/Biliary Tree: No gallstones. No intra or extrahepatic  biliary duct dilatation.    Kidneys/Adrenals: Chronic no adrenal nodule. No focal abnormality  within the visualized portion of either kidney.    Bowel Loops: No evidence bowel obstruction in the upper abdomen.    Nodes: No abdominal lymphadenopathy.    Vasculature: No abdominal aortic aneurysm. Althoughthe study  is  performed without intravenous contrast material, there is no normal  flow signal void in the main portal vein, superior mesenteric vein,  and splenic vein. No abnormal T1 shortening within the main portal  vein on T1 weighted imaging. There is some curvilinear increased  signal in the left liver on T1 weighted imaging, following a  distribution similar to the left intrahepatic portal venous anatomy.    Bones/Musculoskeletal: No focal marrow abnormality is seen within  the visualizedbony structures.    Body  Wall: No abdominal wall hernia the upper abdomen.    Other: Moderate volume ascites is evident.     IMPRESSION:  Study is limited by motion and lack of intravenous contrast  material. There is flow signal void within the mainportal vein,  superior mesenteric vein, and splenic vein, which would not be  consistent with the presence of occlusive thrombus. Since this study  was performed without intravenous contrast, only the void created by  flow artifact can be assessed and nonocclusive thrombus could be  inapparent. There is some curvilinear subtly increased T1 signal in  the central left liver which may be related to motion artifact  although thrombus within portal venous anatomy of the left liver  cannot be excluded.      Electronically Signed    By: Misty Stanley M.D.    On: 02/02/2014 11:34       Verified By: ERIC A. MANSELL, M.D.,   Assessment/Plan:  Assessment/Plan:  Assessment ETOH liver disease: Condition grave.  MELD 40 extremely high 47-monthmortality.  No obvious portal vein thrombus on MRI so no anticoagulation warranted at this time per hematology.  He has significant cholestasis & jaundice.  On prednisolone since May admission under care of Dr RRayann Heman    Appreciate hematology's input.  Appreciate palliative care input. Acute on chronic kidney disease: followed by nephrology, likely ATN Esophageal ulcer: Nonbleeding, on BID PPI Anemia: Hgb stable 10-range Hepatic encephalopathy:  Pt more confused this AM.  Add PRN lactulose to xifaxin but RN aware to hold if more than 3 BMS daily to prevent dehydration   Plan 1) Daily LFTS, INR & CBC 2) continue protonix 470mBID 3) Lactulose PRN TID 4) K repletion per attending Please call if you have any questions or concerns   Electronic Signatures: JoAndria MeuseNP)  (Signed 25-Jun-15 08:35)  Authored: Chief Complaint, VITAL SIGNS/ANCILLARY NOTES, Brief Assessment, Lab Results, Radiology Results, Assessment/Plan   Last  Updated: 25-Jun-15 08:35 by JoAndria MeuseNP)

## 2014-12-03 NOTE — Consult Note (Signed)
PATIENT NAME:  Miguel Soto, Miguel Soto MR#:  [id number] DATE OF BIRTH:  18-Feb-1966  DATE OF CONSULTATION:  02/01/2014  REFERRING PHYSICIAN:  Dr. Leeroy ChaWohl/Nurse Practitioner, Lorenza BurtonKandice Jones. CONSULTING PHYSICIAN:  Sandeep R. Sherrlyn HockPandit, MD  REASON FOR CONSULTATION:  ? borderline thrombosis in alcohol cirrhotic.   HISTORY OF PRESENT ILLNESS:  The patient is a 49 year old gentleman with past medical history significant for significant alcohol abuse, recent admission for acute renal failure, alcoholic hepatitis with elevated liver enzymes, history of hepatic encephalopathy, chronic leukocytosis secondary to prednisone as outpatient, hypoalbuminemia, penile surgery, eye surgery when he was young, wisdom tooth extraction who has been admitted to the hospital on June 22nd when he presented with low blood pressure 67/43. The patient had complaints of decreased appetite, decreased oral intake, extensive diarrhea with lactulose, which was held, and since then diarrhea improved. He was also noted to have creatinine of 4.04 and was admitted for acute renal failure. He had an ultrasound of the abdomen done earlier today, which showed findings consistent with liver disease. There was no flow noted in the portal vein and it was commented that portal vein thrombosis cannot be excluded. There was mild ascites and contracted gallbladder.   Clinically, the patient states that he is feeling weak and tired overall. He denies any prior history of thromboembolic phenomena including deep venous thrombosis, pulmonary embolism, TIA, stroke or heart attack. Denies any known family history of hypercoagulable states or thromboembolic phenomena. He denies any severe abdominal pain. He had EGD earlier today, which is reporting nonbleeding esophageal ulcer and portal hypertensive gastropathy along with duodenitis. PT/INR is abnormal at 1.9 and bilirubin is 24.   PAST MEDICAL HISTORY AND PAST SURGICAL HISTORY: As in HPI above.   FAMILY HISTORY: Denies  any hematological disorders, hypercoagulable states or malignancy.   SOCIAL HISTORY:  Significant alcohol usage including beer and vodka. Used to chew tobacco up until last month. Denies recreational drug usage. Lives with his wife at home.   REVIEW OF SYSTEMS: CONSTITUTIONAL:  Generalized weakness as above. Denies fevers or chills. No night sweats.  HEENT:  Intermittent dizziness. Denies headaches, epistaxis, ear or jaw pain.  CARDIAC:  Currently denies any angina, palpitation, orthopnea or PND.  LUNGS:  Has mild dyspnea on exertion. Denies any new cough, chest pain or hemoptysis.  GASTROINTESTINAL: Recent diarrhea, currently improving. Intermittent nausea. Denies bright red blood in stools, but states that he has had dark stools up until yesterday.  SKIN:  No new rashes or pruritus, having jaundice.  HEMATOLOGIC: As in HPI.  MUSCULOSKELETAL:  No new or progressive bone pains.  EXTREMITIES:  Has ongoing swelling issues, denies pain.  NEUROLOGIC:  No new focal weakness or seizures.  ENDOCRINE:  No polyuria or polydipsia.   PHYSICAL EXAMINATION: GENERAL:  The patient is a moderately-built and nourished individual, resting in bed, currently oriented to self, place and person and answers questions appropriately. Markedly icteric.  VITAL SIGNS:  97.4, 80, 18, 99/60, 100% on room air.  HEENT:  Normocephalic, atraumatic. Extraocular movements intact. Sclerae icteric.  NECK:  Negative for lymphadenopathy.  CARDIOVASCULAR:  S1, S2, regular rate and rhythm.  LUNGS:  Show bilateral good air entry, decreased at bases. No rhonchi.  ABDOMEN:  Soft, nontender, mildly distended. No hepatosplenomegaly clinically. No major ascites clinically.  EXTREMITIES:  Shows bilateral pedal edema. No cyanosis.  SKIN:   Shows jaundice, minor bruising over the extremities.  NEUROLOGIC:  Limited exam. Cranial nerves seem intact. Moves all extremities spontaneously.   LABORATORY DATA: Stool  C. diff negative, culture  negative. Blood culture negative so far. WBC 28,400 with ANC 25,500, hemoglobin 10.4, platelets 128, MCV 102. Creatinine 4.25, BUN 84, calcium 7.9, bilirubin 31.4, alkaline phosphatase 654, ALT 112, AST 214, albumin low at 2.2. INR 1.9. Plasma ammonia less than 10. Serum alpha-fetoprotein normal at 2.1. WBC count yesterday was even higher at 37,600.   IMPRESSION AND RECOMMENDATIONS:  A 49 year old gentleman with medical history as described above, currently admitted with hypotension, acute renal insufficiency, markedly abnormal liver function tests and evidence of cirrhosis on ultrasound, along with evidence of portal hypertension with EGD reporting portal hypertensive gastropathy, coagulopathy with INR at 1.9 and reporting recent dark-colored stools. Ultrasound also reported no flow noted in the portal vein, raising possibility of port vein thrombosis. Hematology has been consulted for this issue. The patient and family at bedside explained about these findings and that we will need to evaluate further. Will get noncontrast MRI to evaluate for portal vein thrombosis and then follow up. I have also explained that if he does have portal vein thrombosis, he would otherwise be a poor candidate for anticoagulation at this time, given ongoing coagulopathy, possible GI blood loss given recent dark stools, with very high risk of bleeding complications. We will follow up after MRI is completed and make further recommendations. The patient and family present are agreeable to this plan.   Thank you for the referral. Please feel free to contact me for additional questions.   ____________________________ Maren Reamer Sherrlyn Hock, MD srp:dmm D: 02/01/2014 20:53:00 ET T: 02/01/2014 21:10:16 ET JOB#: [id number]  cc: Sandeep R. Sherrlyn Hock, MD, <Dictator> Wille Celeste MD ELECTRONICALLY SIGNED 02/02/2014 15:51

## 2014-12-03 NOTE — Consult Note (Signed)
Details:   - GI Note:  I have seen and examined Mr Miguel Soto and agree with Wilhelmenia BlaseKaryn Earle's a/p.   He is very ill currently, severe ETOH hepatitis.   He probably has a 30% chance of mortality.   I suspect he has both a component of hepatic encephalopathy and withdrawal at this time. (tremors are more likely from withdrawal than from H.E).   Recs: - prednisolone 40 mg daily for 28 days, then taper over 16 days - lactulose titrate to 2 - 3 stools per day  - aggressive treatment of ETOH withdrawal.  - close monitoring of lytes - he is not having an active GI bleed so no indication for protonix drip,  will need EGD once other issues have cleared.   Electronic Signatures: Dow Adolphein, Chaye Misch (MD)  (Signed 19-May-15 17:28)  Authored: Details   Last Updated: 19-May-15 17:28 by Dow Adolphein, Talise Sligh (MD)

## 2014-12-03 NOTE — Consult Note (Signed)
Brief Consult Note: Diagnosis: mildly thick GB wall.   Discussed with Attending MD.   Comments: 49 yo obese alcoholic male, with lipase 1400 - 2500, bili 24, SGOT 390, PT 18, INR 1.5, with a mildly thickened gallbladder wall, no gallstones, < 5 mm CBD, and increased hepatic echogenicity on U/S. This GB wall thickening is not an indication of acute cholecystitis; it is equivalent to his increased echogenicity / acute alcoholic hepatitis (and pancreatitis). No surgical indications. Will sign off.  Electronic Signatures: Claude MangesMarterre, Dabria Wadas F (MD)  (Signed 20-May-15 12:43)  Authored: Brief Consult Note   Last Updated: 20-May-15 12:43 by Claude MangesMarterre, Lazarus Sudbury F (MD)

## 2014-12-03 NOTE — Consult Note (Signed)
Chief Complaint:  Subjective/Chief Complaint Pt denies abdominal pain, nausea or vomiting.  More confused this AM.  3 loose BMS documented in chart in past 24 hrs.   VITAL SIGNS/ANCILLARY NOTES: **Vital Signs.:   24-Jun-15 04:15  Vital Signs Type Routine  Temperature Temperature (F) 97.4  Celsius 36.3  Temperature Source oral  Pulse Pulse 80  Respirations Respirations 20  Systolic BP Systolic BP 110  Diastolic BP (mmHg) Diastolic BP (mmHg) 58  Mean BP 72  Pulse Ox % Pulse Ox % 99  Pulse Ox Activity Level  At rest  Oxygen Delivery Room Air/ 21 %   Brief Assessment:  GEN no acute distress, Chronically ill, jaundiced, A/Ox1, confused,  sister-in-law at bedside   Cardiac Regular   Respiratory normal resp effort   Gastrointestinal details normal Soft  Bowel sounds normal  No rebound tenderness  No gaurding  +mild distention, Liver 6FB below RCM, +splenomegaly   EXTR positive edema, Trace pretibial edemea bilat, +asterixis   Additional Physical Exam HEENT: +scleral icterus   Lab Results: Hepatic:  24-Jun-15 04:40   Bilirubin, Total  28.7  Alkaline Phosphatase  590 (45-117 NOTE: New Reference Range 07/02/13)  SGPT (ALT)  108  SGOT (AST)  223  Total Protein, Serum  3.8  Albumin, Serum  1.9  Routine Chem:  24-Jun-15 04:40   Glucose, Serum 87  BUN  73  Creatinine (comp)  4.04  Sodium, Serum 137  Potassium, Serum 3.5  Chloride, Serum 107  CO2, Serum  16  Calcium (Total), Serum  7.5  Osmolality (calc) 295  eGFR (African American)  19  eGFR (Non-African American)  16 (eGFR values <50mL/min/1.73 m2 may be an indication of chronic kidney disease (CKD). Calculated eGFR is useful in patients with stable renal function. The eGFR calculation will not be reliable in acutely ill patients when serum creatinine is changing rapidly. It is not useful in  patients on dialysis. The eGFR calculation may not be applicable to patients at the low and high extremes of body sizes,  pregnant women, and vegetarians.)  Anion Gap 14  Routine Hem:  24-Jun-15 04:40   WBC (CBC)  26.3  RBC (CBC)  2.90  Hemoglobin (CBC)  10.0  Hematocrit (CBC)  29.6  Platelet Count (CBC)  107  MCV  102  MCH  34.3  MCHC 33.7  RDW  17.1  Neutrophil % 90.3  Lymphocyte % 6.8  Monocyte % 2.5  Eosinophil % 0.2  Basophil % 0.2  Neutrophil #  23.7  Lymphocyte # 1.8  Monocyte # 0.7  Eosinophil # 0.1  Basophil # 0.0 (Result(s) reported on 02 Feb 2014 at 05:14AM.)   Radiology Results: [place]:    23-Jun-15 09:14, US Abdomen General Survey  US Abdomen General Survey   REASON FOR EXAM:    cirrhosis, obstructive jaundice, compare to [place] last   month, r/o biliary obstructi  COMMENTS:       PROCEDURE: [place]  - US ABDOMEN GENERAL SURVEY  - Feb 01 2014  9:14AM     CLINICAL DATA:  Cirrhosis.  Jaundice.    EXAM:  ULTRASOUND ABDOMEN COMPLETE    COMPARISON:  CT 12/31/2013.    FINDINGS:  Gallbladder:  Gallbladder is contracted. Gallbladder wall is thickened .  Gallbladder wall thickness is 6.3 mm. This may be related to  hyperproteinemia. No gallstones identified. Negative Murphy sign.    Common bile duct:    Diameter: 5.7 mm.    Liver:    Liver is echodense consistent  with fatty infiltration and/or  hepatocellular disease. Similar findings noted on CT of 12/31/2012.  No flow is noted in the portal vein. Portal vein thrombosis cannot  be excluded. Visualized hepatic veins are patent. Mild ascites.  IVC:    No abnormality visualized.    Pancreas:    Visualized portion unremarkable.    Spleen:    Size and appearance within normal limits.    Right Kidney:    Length: 5.1cm.. Echogenic consistent with chronic renal disease. No  hydronephrosis.    Left Kidney:    Length: 1.6 cm. Left kidney was poorly visualized due to patient's  body habitus, anasarca, and patient discomfort.    Abdominal aorta:    No aneurysm visualized.    Other findings:    This was a limited exam due  to patient's body habitus, anasarca, and  patient discomfort .   IMPRESSION:  1. Contracted gallbladder. Gallbladder wall thickening is present at  6.3 mm. This may be related to hyperproteinemia. No gallstones  identified. Negative Murphy sign. Common bile duct nondistended.    2. Echodense liver suggesting fatty infiltration and/or  hepatocellular disease. Similar findings noted on CT of 12/31/2012.    3. No flow noted in the portal vein. Portal vein thrombosis cannot  be excluded.    4. Mild ascites. This was a limited exam due to patient's body  habitus, anasarca, and patient discomfort.    Electronically Signed    By: Marcello Moores  Register    On: 02/01/2014 09:32         Verified By: Osa Craver, M.D., MD   Assessment/Plan:  Assessment/Plan:  Assessment ETOH liver disease: Condition grave.  MELD 40 with 100% risk of 5-monthmortality.  He has significant cholestasis & jaundice.  On prednisolone since May admission under care of Dr RRayann Heman  MRI pending to look for portal vein thrombus.  Appreciate hematology's input.   Acute on chronic kidney disease: followed by nephrology, likely ATN Esophageal ulcer: Nonbleeding, on BID PPI Anemia: Hgb stable 10-range   Plan 1) Follow up MRI 2) Daily LFTS, INR & CBC 3) continue protonix 486mBID 4) continue Rocephin 1 gram IV daily for GI bleeding in cirrhotic 5) Palliative care consult Please call if you have any questions or concerns   Electronic Signatures: JoAndria MeuseNP)  (Signed 24-Jun-15 09:00)  Authored: Chief Complaint, VITAL SIGNS/ANCILLARY NOTES, Brief Assessment, Lab Results, Radiology Results, Assessment/Plan   Last Updated: 24-Jun-15 09:00 by JoAndria MeuseNP)

## 2014-12-03 NOTE — Consult Note (Signed)
PATIENT NAME:  Miguel Soto, Miguel Soto MR#:  [id number] DATE OF BIRTH:  Jul 01, 1966  DATE OF CONSULTATION:  09/10/2013  REFERRING PHYSICIAN:  Dorothea Glassman, M.D. CONSULTING PHYSICIAN:  Arizbeth Cawthorn B. Isak Sotomayor, MD  REASON FOR CONSULTATION:  To evaluate a patient with alcoholism.   IDENTIFYING DATA:  Miguel Soto is a 49 year old alcoholic.   CHIEF COMPLAINT:  "I need to call my wife."   HISTORY OF PRESENT ILLNESS:  Miguel Soto is a troubled alcoholic.  He was discharged from [facility] after 28 days of treatment on the day he got drunk.  His wife upon return from work found him on the stairs, collapsed and drunk.  She brought him to the hospital.  The patient admits that he wants to drink and even if we kept him in a treatment facility for two years he will start drinking immediately.  The wife was supposed to take a restraining order last time the patient was in the Emergency Room in December, but apparently she changed her mind, she is asking our nurse if she has to take him home.  Without her help the patient is homeless, but he does not realize the consequences of his actions.  He believes that he will be able to return home and continue drinking.  The patient is adamantly against psychiatric admission.  He does not like to be in a lock-up and wants to leave.  He denies any symptoms of depression, psychosis or symptoms suggestive of bipolar mania.  He denies, other than alcohol, substance use.  He admits that he had 2 pints of vodka immediately following return to home.   PAST PSYCHIATRIC HISTORY:  The patient has several detoxes in the past.  He participated in residential treatment in [place] at least twice.  He has a history of blackouts and complicated delirious.  He was prescribed antidepressants in the past, but does not find them helpful.  He has been drinking continuously since 2001 with very brief periods of sobriety.   FAMILY PSYCHIATRIC HISTORY:  Many family members with  alcoholism.   PAST MEDICAL HISTORY:  Gout.   ALLERGIES:  AZITHROMYCIN.   MEDICATIONS ON ADMISSION:  Indomethacin 50 mg every 12 hours as needed, Lexapro 10 mg daily, trazodone 100 mg at night, allopurinol daily, colchicine 0.6 mg daily, magnesium oxide 400 mg daily, pantoprazole 40 mg twice daily.   SOCIAL HISTORY:  He is married, has two children.  The wife has been very supportive, but seems to be tired, but unable to take restraining order out.  The patient is no longer working, although he still may be on some kind of disability.   REVIEW OF SYSTEMS:  CONSTITUTIONAL:  No fevers or chills.  No weight changes.  EYES:  No double or blurred vision.  EARS, NOSE, THROAT:  No hearing loss.  RESPIRATORY:  No shortness of breath or cough.  CARDIOVASCULAR:  No chest pain or orthopnea.  GASTROINTESTINAL:  No abdominal pain, nausea, vomiting, or diarrhea.  GENITOURINARY:  No incontinence or frequency.  ENDOCRINE:  No heat or cold intolerance.  LYMPHATIC:  No anemia or easy bruising.  INTEGUMENTARY:  No acne or rash.  MUSCULOSKELETAL:  Positive for gout.  NEUROLOGIC:  No tingling or weakness.  PSYCHIATRIC:  See history of present illness for details.   PHYSICAL EXAMINATION: VITAL SIGNS:  Blood pressure 137/75, pulse 96, respirations 18, temperature 98.6.  GENERAL:  This is a well-developed male in no acute distress.  The rest of the physical examination is deferred  to his primary attending.   LABORATORY DATA:  Chemistries are within normal limits.  Blood alcohol level 383.  LFTs within normal limits except for alkaline phosphatase 133 and AST 44.  Urine tox screen is negative for substances.  CBC within normal limits except for white blood count of 13.6.  Urinalysis is not suggestive of urinary tract infection.  Serum acetaminophen and salicylates are low.   MENTAL STATUS EXAMINATION ON ADMISSION:  The patient is alert and oriented to person, place, time and situation.  He is pleasant, polite  and cooperative.  He is adequately groomed, wearing hospital scrubs and a yellow shirt.  He maintains good eye contact.  His speech is of normal rhythm, rate and volume.  Mood is fine with slightly irritable affect.  Thought process is logical and goal oriented with its own logic.  Thought content:  He denies suicidal or homicidal ideation.  There are no delusions or paranoia.  There are no auditory or visual hallucinations.  His cognition is grossly intact.  He registers 3/3 and recalls 3/3 objects after 5 minutes. He can spell "world" forward and backward. He knows current president. His intelligence and fund of knowledge are average. His insight and judgment are extremely poor.   DIAGNOSES: AXIS I:  Alcohol dependence.  AXIS II:  Deferred.  AXIS III:  Gout.  AXIS IV:  Substance abuse, occupational, financial, marital conflict.  AXIS V:  Global assessment of functioning on admission 35.   PLAN:  1.  The patient no longer meets criteria for involuntary inpatient psychiatric commitment.  Please discharge as appropriate.  I will terminate proceedings.   2.  He does need alcohol detox.  We will discontinue medications.  3.  The patient does not want to follow up with mental health provider.  4.  He is homeless.    ____________________________ Ellin GoodieJolanta B. Jennet MaduroPucilowska, MD jbp:ea D: 09/10/2013 21:49:51 ET T: 09/11/2013 02:39:08 ET JOB#: [id number]  cc: Lajada Janes B. Jennet MaduroPucilowska, MD, <Dictator> Shari ProwsJOLANTA B Dariel Pellecchia MD ELECTRONICALLY SIGNED 10/09/2013 6:42

## 2014-12-03 NOTE — H&P (Signed)
PATIENT NAME:  Miguel Soto, Miguel Soto MR#:  [id number] DATE OF BIRTH:  03/07/1966  DATE OF ADMISSION:  01/31/2014  ADMITTING PHYSICIAN: Enid Baasadhika Kalisetti, MD   PRIMARY CARE PHYSICIAN: Jorje GuildGlenn R. Beckey DowningWillett, MD  CHIEF COMPLAINT: Sent in from [facility] for hypotension.   HISTORY OF PRESENT ILLNESS: Miguel Soto is a 49 year old Caucasian male with past medical history significant for significant alcohol use, drinking up to a pint of vodka every day, alcoholic hepatitis, recent admission from Dec 28, 2013, to Jan 08, 2014, for acute on chronic alcoholic hepatitis, acute renal failure, hepatic encephalopathy, was discharged on the 30th of May. The patient has some occasional confusion, tremors, and hallucinations. Most of the history is obtained from his wife, who is at bedside. According to her, since the last discharge which was about 3 weeks ago, he has had a couple of ER visits, the most recent one being about a week ago when he was diagnosed with acute renal failure (creatinine was 1.9), and he has also had a UTI. The patient was discharged on Bactrim and he was also asked to start on Lasix and Aldactone, as his edema was worsening. Today, [facility] followup visit, noted to have blood pressure of 67/43. The patient has been complaining of some dizziness over the last 4 days, has had decreased appetite, and has not been drinking anything but sleeping most of the day. He also had extensive diarrhea with lactulose about a week ago, so they had to hold the lactulose and he has been getting rifaximin at this time. Since then, the diarrhea has gotten better, not completely resolved. It is not watery as it was before, but just loose. He was brought to the ER and noted to be hypotensive, and his labs indicated that his creatinine had gotten worse up to 4.04, so he is being admitted for acute renal failure and hypotension.  PAST MEDICAL HISTORY:  1.  Significant alcohol abuse. 2.  Alcoholic hepatitis  with elevated transaminases.  3.  Recent admission for acute renal failure.  4.  History of hepatic encephalopathy.  5.  Chronic leukocytosis from being on prednisone as outpatient.  6.  Hypoalbuminemia.   PAST SURGICAL HISTORY: 1.  Penile surgery.  2.  Eye surgery when he was young. 3.  Extraction of wisdom tooth.  ALLERGIES TO MEDICATIONS: INTOLERANT TO ERYTHROMYCIN.  HOME MEDICATIONS:   1.  Thiamine 300 mg p.o. daily.  2.  Folic acid 1 mg p.o. daily. 3.  Bactrim double-strength 1 tablet p.o. b.i.d., started about a week ago.  4.  Lasix 20 mg p.o. daily, started about 8 days ago.  5.  Rifaximin 550 mg p.o. b.i.d.  6.  Aldactone 50 mg p.o. daily, started about 8 days ago.  7.  Prednisolone 15 mg/5 mL oral syrup, 13.33 mL p.o. daily.   SOCIAL HISTORY: Lives at home with his wife. He used to drink beer since he was 49 years old and switched over to vodka for the last 5 years, has been drinking about a pint of vodka every day. Used to chew tobacco up until the last month. No drug abuse. Lost his job in August 2014.   FAMILY HISTORY: Father was an alcoholic and deceased, and mom overdosed on her prescription pain medications and is deceased.  REVIEW OF SYSTEMS: Very difficult to be obtained secondary to patient'Soto intermittent confusion.   PHYSICAL EXAMINATION:   VITAL SIGNS: Temperature 95.9 degrees Fahrenheit, pulse 80, respirations 21,  blood pressure 91/67, pulse oximetry 97% on room air.  GENERAL: Well-built, well-nourished male lying in bed with intermittent tremulousness.  HEENT: Normocephalic, atraumatic. Pupils are equal, round, reacting to light. Icteric sclerae. Extraocular movements are intact. Oropharynx is clear without any erythema, mass or exudates.  NECK: Supple. No thyromegaly, JVD or carotid bruits. No lymphadenopathy. Normal range of motion without any pain.  LUNGS: Moving air bilaterally. No wheeze or crackles. No use of accessory muscles for breathing.   CARDIOVASCULAR: S1, S2, regular rate and rhythm. No murmurs, rubs or gallops.  ABDOMEN: Soft, distended. No hepatosplenomegaly. Normal bowel sounds. EXTREMITIES: 1+ pedal edema, 1+ dorsalis pedis pulses palpable bilaterally. No clubbing or cyanosis.  LYMPHATICS: No cervical lymphadenopathy.  SKIN: Jaundiced extremely. No rash or ulcerations seen. Minor petechiae with spider nevi noticed on his chest and also legs.  NEUROLOGIC: Cranial nerves are intact. No focal motor or sensory deficits. The patient does have increased intentional tremors and also positive for asterixis.  PSYCHOLOGIC: The patient is awake, alert, oriented x 3, falls back to sleep and occasionally gets confused as well.   LABORATORY DATA: WBC 37.6, hemoglobin 11.8, hematocrit 36.0, platelet count 191.   Sodium 130, potassium 3.7, chloride 100, bicarbonate 16, BUN 73, creatinine 4.04, glucose 142, and calcium of 8.1.   Alkaline phosphatase 767, ALT 120, AST 228; bilirubin is 29.8 and albumin of 1.7. Troponin is 0.05. Ammonia is less than 10. Urinalysis is pending at this time and chest x-ray showing hypoinflated lungs, no evidence of any consolidation, effusion or pneumothorax, no acute osseous abnormality noted as well.   ASSESSMENT AND PLAN:  This is a 49 year old male with history of alcohol abuse and alcoholic liver disease, recent admission to the hospital for alcoholic hepatitis, was sent in from [facility] for hypotension today. Also noted to be in acute renal failure. 1.  Acute renal failure, baseline has normal creatinine before January 2015. His last admission, he went into renal failure, was discharged with a creatinine of 1.5. However, had an ER visit about a week ago, at which time his creatinine was 1.9. The patient has been on Lasix, spironolactone, and Bactrim for the last week. Now his creatinine is at 4.04. Hold nephrotoxic agents. Since his p.o. intake has been decreased and he was having a lot of diarrhea, could  be prerenal along with acute tubular necrosis. Gentle IV hydration, albumin infusion, and nephrology consult. Get a renal ultrasound to rule out any obstruction. Continue to monitor. Strict I'Soto and O'Soto.   2.  Acute on chronic alcoholic liver hepatitis, worsening transaminitis. Follow up, GI consult. Continue to monitor.  3.  History of hepatic encephalopathy, currently none, with worsening diarrhea. Lactulose is on hold, so continue rifaximin. Ammonia level is still less than 10. The patient does have intermittent hallucinations and confusion at home.  4.  Recent urinary tract infection last week, was treated with Bactrim for a week and will get a repeat urinalysis now, especially with his leukocytosis and hypothermia. 5.  Hypotension, could be hypovolemia and gentle hydration is recommended. Albumin infusion. Monitor. No evidence of infection so far. Urinalysis is pending.  6.  Leukocytosis from being chronically on prednisone as outpatient. Monitor at this time. He is on prednisone for some positive autoimmune tests on his liver.  7.  Metabolic acidosis. Continue bicarbonate drip and recheck.  CODE STATUS: Full code.   TIME SPENT ON ADMISSION: 50 minutes.   ____________________________ Enid Baas, MD rk:jcm D: 01/31/2014 13:03:29 ET T: 01/31/2014  14:39:56 ET JOB#: [id number]  cc: Enid Baas, MD, <Dictator> Jorje Guild. Beckey Downing, MD [facility] Enid Baas MD ELECTRONICALLY SIGNED 01/31/2014 16:41

## 2014-12-03 NOTE — Consult Note (Signed)
   Comments   Follow up visit made. Spoke with pt's wife and her sister. Pt sleeping and did not awaken during my visit. Wife very emotional about pt's condition. Just accepting the severity of pt's condition. She is unsure about best discharge plan for pt. I will follow up with her tomorrow.   Electronic Signatures: Phifer, Harriett SineNancy (MD)  (Signed 24-Jun-15 20:55)  Authored: Palliative Care   Last Updated: 24-Jun-15 20:55 by Phifer, Harriett SineNancy (MD)

## 2014-12-03 NOTE — H&P (Signed)
PATIENT NAME:  Miguel Soto, Miguel Soto MR#:  [id number] DATE OF BIRTH:  Dec 26, 1965  DATE OF ADMISSION:  12/28/2013  PRIMARY CARE PHYSICIAN: Barry BrunnerGlenn Willett, MD  CHIEF COMPLAINT: Came in with not feeling well, chest pressure, black stool, blood in the urine, turned yellow.   HISTORY OF PRESENT ILLNESS: This is a 49 year old man who is an alcoholic. He drinks at least a pint of vodka per day. He has been yellow since Saturday. He has lost a lot of weight and muscle. He is not eating or taking his meds, only drinking alcohol. He has been having some diarrhea. He has been urinating blood. He has had some black stools. Last night he had some choking, also had some chest pressure. His last alcoholic beverage was yesterday. The patient is a poor historian. History obtained from old chart and family at bedside. The patient states that he has had some deep back pain for about 6 months, he got laid off from work, and he has also been slurring his speech.   PAST MEDICAL HISTORY: Gout, hypertension, alcohol abuse, depression.   PAST SURGICAL HISTORY: Penile surgery, eye surgery and wisdom teeth.   MEDICATIONS: None since December.   ALLERGIES: In the computer is ERYTHROMYCIN.   SOCIAL HISTORY: He drinks about a pint of vodka a day over 20 years. Chews tobacco. No IV drug use. He works in [employer].   FAMILY HISTORY: Father deceased. He was an alcoholic. Mother deceased. She abused prescription pain medications.   REVIEW OF SYSTEMS: Difficult to obtain secondary to altered mental status.   PHYSICAL EXAMINATION: VITAL SIGNS: Temperature 98.1, pulse 123, respirations 20, blood pressure 115/75, pulse ox 99% on room air.  GENERAL: No respiratory distress. Positive tremor is seen.  EYES: Conjunctivae jaundiced. Lids normal. Pupils equal, round, and reactive to light. Extraocular muscles intact. No nystagmus.  EARS, NOSE, MOUTH AND THROAT: Nasal mucosa: No erythema. Throat: No erythema. No exudate seen. Lips and gums: No  lesions.  NECK: No JVD. No bruits. No lymphadenopathy. No thyromegaly. No thyroid nodules palpated.  RESPIRATORY: Lungs clear to auscultation. No use of accessory muscles to breathe. No rhonchi, rales, or wheeze heard.  CARDIOVASCULAR: S1, S2 normal, tachycardic. No gallops, rubs, or murmurs heard. Carotid upstroke 2+ bilaterally. No bruits.  EXTREMITIES: Dorsalis pedis pulses 1+ bilaterally. Trace edema of the lower extremity.  ABDOMEN: Soft, distended. No organosplenomegaly. Normoactive bowel sounds. No masses felt.  LYMPHATIC: No lymph nodes in the neck.  MUSCULOSKELETAL: Trace edema. No clubbing. No cyanosis.  SKIN: Jaundiced. No ulcers or lesions seen.  NEUROLOGIC: Cranial nerves II through XII grossly intact. DTRs 1+ bilateral lower extremity. Positive tremor bilaterally.  PSYCHIATRIC: The patient is confused. Does answer some questions, very tangential.   DIAGNOSTIC DATA: Chest x-ray: No evidence of pneumonia nor CHF or other cardiopulmonary disease.   BNP 63. PT 16.4. INR 1.3. PTT 48.8. Ethanol level 76. Troponin negative. Lipase 1402. Glucose 91, BUN 23, creatinine 1.93, sodium 134, potassium 2.9, chloride 94, CO2 17, calcium 6.4, total bilirubin 24.6, alkaline phosphatase 521, ALT 71, AST 399, total protein 6.1, albumin 2.2. White blood cell count 10.2, H and H 11.6 and 35.1, platelet count 110,000.   Ultrasound of the abdomen showed appearance of the liver is abnormal. May reflect cirrhosis. Gallbladder adequately distended. May be gallbladder wall thickening. Sludge is present. Stones not demonstrated. No Murphy'Soto sign. No splenomegaly. Magnesium added on to initial labs, 0.6.   ASSESSMENT AND PLAN: 1.  Alcoholic hepatitis. Maddrey discriminate function score greater  than 32. The patient was started on prednisolone 40 mg daily. Will need to be on that for 28 days followed by a 16 day taper. The patient is jaundice with a very high bilirubin. Signs of cirrhosis on ultrasound. I spoke  with the patient that he must stop alcohol at all costs, that he is slowly killing himself with the alcohol.  2.  Severe hypokalemia and hypomagnesemia. We will replace potassium in IV fluids and magnesium IV and orally.  3.  Acute encephalopathy, likely hepatic. Will check an ammonia level. Lactulose started by gastroenterology. Will add Xifaxan if ammonia level is high.  4.  Alcohol abuse with slight tremor. The patient has an elevated alcohol level. Likely will go through severe delirium tremens in a few days. The patient was put on CIWA protocol. I advised that he must stop alcohol at all costs. He is slowly killing himself with the alcohol.  5.  Chest pain. Will order cardiac enzymes, put on telemetry. The patient does have a sinus tachycardia. Could be secondary to dehydration. Will give IV fluid hydration and low-dose metoprolol if heart rate does not come down with IV fluids.  6.  Guaiac-positive stool. We will get a gastroenterology consultation. The patient was put on a Protonix drip. Will get serial hemoglobins.  7.  Pancreatitis with elevated lipase. Will keep n.p.o., give IV fluid hydration and continue to monitor.  8.  Acute renal failure with creatinine of 1.93. Continue to monitor.  9.  Thrombocytopenia. Likely secondary to long-standing alcohol abuse.  10.  Elevated liver function tests. Likely with alcoholic hepatitis.  TIME SPENT ON ADMISSION: 55 minutes.   ____________________________ Herschell Dimes. Renae Gloss, MD rjw:sb D: 12/28/2013 16:08:56 ET T: 12/28/2013 16:33:20 ET JOB#: [id number]  cc: Herschell Dimes. Renae Gloss, MD, <Dictator> Jorje Guild. Beckey Downing, MD Salley Scarlet MD ELECTRONICALLY SIGNED 12/29/2013 14:04

## 2014-12-03 NOTE — Consult Note (Signed)
PATIENT NAME:  Miguel Soto, WEEKLY MR#:  [id number] DATE OF BIRTH:  September 14, 1965  DATE OF CONSULTATION:  12/28/2013  REFERRING PHYSICIAN:  Herschell Dimes. Renae Gloss, MD CONSULTING PHYSICIAN:  Hardie Shackleton. Colin Benton, PA-C  ATTENDING GASTROENTEROLOGIST:  Dow Adolph, MD  REASON FOR CONSULTATION: Alcohol use with clinical jaundice and heme-positive stool.   HISTORY OF PRESENT ILLNESS: [name] who is seen in his exam room today with an extremely altered mental status, making it very difficult to obtain a history from. He is extremely confused and slurring his words and, therefore, I am unable to get an accurate history. Based on his medical chart, I was able to review his lab work and he does have markedly elevated bilirubin of 24.6 and clinical jaundice. His AST is markedly elevated. Lipase is elevated as well. He was found to have heme-positive stool. Vital signs currently are stable outside of some sinus tachycardia. When asking the patient if he is experiencing any pain, he explains the only pain he is currently having is his head and then he goes on to explain to me that he is thirsty. His speech is very difficult to understand and does seem to jump from 1 idea to the other and, therefore, it is very difficult to determine exactly how he has been feeling recently. From reviewing his medical records from prior hospitalizations, it does appear that he has struggled with alcoholism since 2001 and has been in residential treatment for alcoholism in [place] at least twice. He does have a history of ongoing delirium and blackouts associated with this as well.   PAST MEDICAL HISTORY: Per medical records, he has a history of gout.   ALLERGIES: AZITHROMYCIN.   SOCIAL HABITS: Per medical records, it does seem that he has struggled with alcoholism since 2001 and drinks quite heavily. He has participated in residential programs in [place] at least twice for his alcoholism. Remainder of his social  history is unobtainable.   FAMILY HISTORY: Unobtainable due to the patient's altered mental status.   HOME MEDICATIONS: Unobtainable due to the patient's mental status. I will review our reconciliation list once it is made available.   REVIEW OF SYSTEMS: Unobtainable due to the patient's mental status.   PHYSICAL EXAMINATION: VITAL SIGNS: Blood pressure 115/75, heart rate 123, respirations 20, temperature 98.1, bedside pulse oximetry 99%.  GENERAL: [name] resting in his hospital exam room who does appear to be clinically jaundiced and is extremely confused. Mental status is altered. He does seem to be somewhat jittery with shakiness to his extremities. He is alert but unable to tell exactly how oriented he is.  HEAD: Atraumatic, normocephalic.  NECK: Supple. No lymphadenopathy noted.  HEENT: Sclerae are extremely icteric, as is his skin, it being clinically jaundiced. Mucous membranes moist.  LUNGS: Respirations are even and unlabored. Clear to auscultation bilateral anterior lung fields.  HEART: Sinus tachycardia, S1 and S2 noted.  ABDOMEN: Soft, nondistended. Does not appear to be tender to palpation. Normoactive bowel sounds noted in all 4 quadrants. No guarding or rebound. No masses, hernias or organomegaly appreciated though exam is limited secondary to body habitus.  RECTAL: Deferred.  PSYCHIATRIC: Mental status is very difficult to determine due to his altered state currently.  EXTREMITIES: Negative for lower extremity edema, 2+ pulses noted in bilateral upper extremities.   LABORATORY DATA: White blood cells 10.2, hemoglobin 11.6, hematocrit 35.1, platelets 110, MCV 100. Sodium 134, potassium 2.9, BUN 23, creatinine 1.93, glucose 91, total bilirubin  24.6, alkaline phosphatase 521, ALT 71, AST 399, albumin 2.2. INR 1.3, PT 16.4. Lipase 1402. Troponins were negative. Heme-positive stool.   IMAGING: Ultrasound of the abdomen was obtained on the patient, showing a  mildly thickened gallbladder wall but no clear stones. Sludge could not be excluded. Common bile duct was normal at 4.9 mm. Liver was somewhat enlarged with an irregular surface, suggesting likely underlying cirrhosis. There is no focal liver mass. No splenomegaly.   His Maddrey's discriminate function score was calculated to be 32.4199.   ASSESSMENT: 1.  Alcohol-induced hepatitis.  2.  Altered mental status/hepatic encephalopathy.  3.  Clinical jaundice with elevated liver function tests.  4.  Heme-positive stool.  5.  Alcohol use and abuse since 2001.  6.  Abnormal ultrasound suggesting an enlarged liver with an irregular surface, likely underlying cirrhosis.  7.  Sinus tachycardia.   PLAN: I have discussed this patient's case in detail with Dr. Dow AdolphMatthew Rein, who was involved in the development of the patient's plan of care. We did calculate his Maddrey's discriminate function score and it is suggestive of alcohol-induced hepatitis. Therefore, we do recommend starting the patient on prednisolone 40 mg once daily for the next 28 days. Following this period, we recommend tapering this over a period of 16 days. Because he is extremely altered and appears to be encephalopathic, we will start him on lactulose 30 mL q.4. We do recommend keeping a close eye on his liver enzymes, lipase and CBC to monitor a trend. Also, with the presence of heme-positive stool, we would like to monitor for any signs of gastrointestinal blood loss. Alcohol withdrawal protocol should be initiated per internal medicine recommendations. We will continue to monitor this patient closely and make further recommendations pending his response to the prednisolone and lactulose and per clinical course. We recommend keeping him n.p.o. for now. Prognosis is guarded.   Thank you so much for this consultation and for allowing us to participate in the patient's plan of care.   ATTENDING GASTROENTEROLOGIST: Dow AdolphMatthew Rein, MD    ____________________________ Hardie ShackletonKaryn M. Maricel Swartzendruber, PA-C kme:cs D: 12/28/2013 13:51:05 ET T: 12/28/2013 14:06:35 ET JOB#: [id number]  cc: Hardie ShackletonKaryn M. Danea Manter, PA-C, <Dictator> Hardie ShackletonKARYN M Eyla Tallon PA ELECTRONICALLY SIGNED 12/28/2013 16:09

## 2014-12-03 NOTE — Consult Note (Signed)
HEMATOLOGY followup note -patient resting, wife present at bedside. States that he is still weak, disoriented.unable to obtain.weak and lethargic-looking, otherwise no acute distress. Markedly icteric.           vitals - 97.4, 80, 20, 107/70, 100% on room air           lungs - b/l good air entry           abd - mildly distended bilirubin 28.7, albumin 1.9, creatinine 4.04 MRI abdomen (noncontrast) - IMPRESSION:  Study is limited by motion and lack of intravenous contrast material. There is flow signal void within the main portal vein,mesenteric vein, and splenic vein, which would not be consistent with the presence of occlusive thrombus. Since this study was performed without intravenous contrast, only the void created by flow artifact can be assessed and nonocclusive thrombus could be inapparent. There is some curvilinear subtly increased T1 signal incentral left liver which may be related to motion artifact although thrombus within portal venous anatomy of the left liver cannot be excluded.  49 year old gentleman admitted with hypotension, acute renal insufficiency, markedly abnormal liver function tests and evidence of cirrhosis on ultrasound, along with evidence of portal hypertension with EGD reporting portal hypertensive gastropathy, coagulopathy with INR at 1.9 and reporting recent dark-colored stools. Ultrasound also reported no flow noted in the portal vein, raising possibility of port vein thrombosis. Hematology was consulted for this issue. MRI of the abdomen although noncontrast study does not report any obvious evidence of occlusive portal vein thrombosis. Given this, do not see any indication to consider anticoagulation, patient also would otherwise be a poor candidate for anticoagulation (given ongoing coagulopathy, possible GI blood loss given recent dark stools, with very high risk of bleeding complications). Will sign off the case, please reconsult hematology if needed. Patient's wife  explained above details.    Electronic Signatures: Izola PricePandit, Sandeep Raj (MD)  (Signed on 24-Jun-15 19:14)  Authored  Last Updated: 24-Jun-15 19:14 by Izola PricePandit, Sandeep Raj (MD)

## 2014-12-20 NOTE — H&P (Signed)
Psychiatric Admission Assessment Adult Patient Identification:  49 year-old male Date of Evaluation:  03/17/2013 Chief Complaint:  "have been drinking a lot, need help with detox and rehab." History of Present Illness (8 essential elements):  Patient is a 49 yo married caucasian male with a long history of alcohol dependence with no previous admissions or treatmenrt for alcohol dependence or detox. He reports his drinking has gotten worse over the years where he is now drinking a fifth of vodka every day. Endorsing withdrwal symptoms of abdominal cramps, nausea, dizziness when he does not drink. Reports that this past  saturday, he passed out after drinking which upset his family greatly and led him to seek help. He endorses feeling anxious and depressed, denious suicidal thoughts. Denies auditory or visual hallucinations. Denies history of withdrawal seizures or DTs or delirium. Denies use of other illicit drugs.   Associated Signs/SymptomsSymptoms:  Depressed mood, anhedonia, poor energy (Hypo) Manic Symptoms:  denies Anxiety Symptoms:  worries a lot Psychotic Symptoms:  denies PTSD Symptoms:  Denies  Psychiatric Specialty Exam: Physical Exam:  Completed in ED, reviewed, stable  ROS:   CONSTITUTIONAL: No weight loss, fever, chills, weakness or fatigue. HEENT: Eyes: No diplopia or blurred vision. ENT: No earache, sore throat or runny nose.  CARDIOVASCULAR: No pressure, squeezing, strangling, tightness, heaviness or aching about the chest, neck, axilla or epigastrium.  RESPIRATORY: No cough, shortness of breath, dyspnea or orthopnea.  GASTROINTESTINAL: No nausea, vomiting or diarrhea.  GENITOURINARY: No dysuria, frequency or urgency.  MUSCULOSKELETAL: No muscle or joint pain, stiffness.  SKIN: No change in skin, hair or nails.  NEURO: No paresthesias, fasciculations, seizures or weakness.  PSYCHIATRIC: detox from alcohol, depressed, anxious ENDOCRINE: No heat or cold intolerance, polyuria or  polydipsia.  HEMATOLOGICAL: No easy bruising or bleeding.   Vital Signs:  Blood pressure 143/97, pulse 90, temperature 98.3 degrees F oral, respiratory rate 18  General Appearance:  Neat  Eye Contact:  Fair  Speech:  Normal rate  Volume:  Normal  Mood:  Agitated  Affect:  Flat  Thought Process:  organized  Orientation:  Alert and oriented x 3  Thought Content:  depressed, anxious  Suicidal Thoughts: Denies  Homicidal Thoughts:  Denies  Memory: fair  Judgment:  fair  Insight:  fair  Psychomotor Activity:  normal  Concentration:  fair  Recall:  fair  Akathisia:  None  Handed:  Right  AIMS (if indicated):    Assets:  Resilience, physical health, social support  Sleep:  0 hours   Past Psychiatric History: Diagnosis:  alcohol Dependence  Hospitalizations:  none previously  Outpatient Care:  None  Substance Abuse Care:  None  Self-Mutilation:  None  Suicidal Attempts:  Denies  Violent Behaviors:  Denies   Past Medical History: hypertension  Past Medical (concussions, head injury, cardiac issues with apnea episode):  None  Allergies:  None  PTA Medications:  None  Previous Psychotropic Medications:  None  Medication/Dose:  None           Substance Abuse History in the last 12 months:  Alcohol, a fifth of liquor daily  Consequences of Substance Abuse:  family issues  Social History:  Lives with his wife and children  Additional Social History: Marital Status:  married Education:  Automotive engineerCollege History of Abuse (Emotional/Physical/Sexual):  Denies Hotel managerMilitary History:  None  Family History:  Father was alcoholic, committed suicide  No results found for this or any previous visit (from the past 72 hours).Evaluations Assessment:I:  alcohol dependence,  Mood disorder nosII:  DeferredIII:  HTN AXIS IV:  chronic alcohol abuse AXIS V:  35  Treatment Plan/Recommendations:  Review of chart, vital signs, medications, and notes. 1. Admit for crisis management and  stabilization; estimated length of stay 5-7 days.Individual and group therapy encouraged.Medication management for alcohol detox. 4. Coping skills for depressionContinue crisis stabilization and management.Address health issues: Patient had a fall this morning, CT scan obtained, will follow up.Treatment plan in progress to prevent relapse of depression and anxiety.Psychosocial education regarding relapse prevention and self-care.Health care follow-up for any health concerns that may arise.Call for consult with hospitalist for addtional specialty patient services as needed.  Current Medications: Lexapro 10mg  po daily, Ativan based on CIWA scores   Observation Level/Precautions:  Every 15 minute checks  Laboratory:  Completed and reviewed, stable  Psychotherapy:  Individual and group therapy  Medications:  Management  Consultations:  None  Discharge Concerns:  Detox completed and stabilization  Estimated LOS:  5-7 days  Other:  certify that inpatient services furnished can reasonably be expected to improve the patient's condition.  Maryclare LabradorHima Carina Chaplin, MD  Electronic Signatures: Patrick Northavi, Micaila Ziemba (MD)  (Signed on 06-Aug-14 10:25)  Authored  Last Updated: 06-Aug-14 10:25 by Patrick Northavi, Jacob Chamblee (MD)

## 2015-04-12 IMAGING — CT CT ABD-PELV W/O CM
2 of 4 series · 15 of 46 positions shown, 17 images · non-contrast
Comparison: Abdominal ultrasound from 3 days prior

CLINICAL DATA: Ethanol pancreatitis with worsening lipase.
Hepatitis.

EXAM:
CT ABDOMEN AND PELVIS WITHOUT CONTRAST
TECHNIQUE: Multidetector CT imaging of the abdomen and pelvis was performed
following the standard protocol without IV contrast.

[Series 2: routine abd pel without · axial · non-contrast · 0.76mm/px · z∈[+385,+860]mm · 12 of 109 slices shown, 14 images]
[im 9/109  soft-tissue]
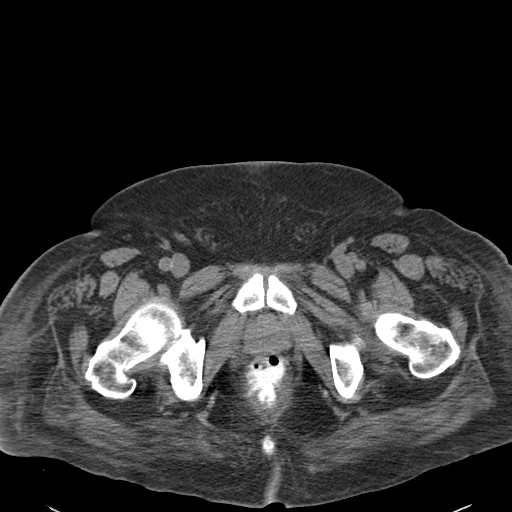
[im 9/109  bone]
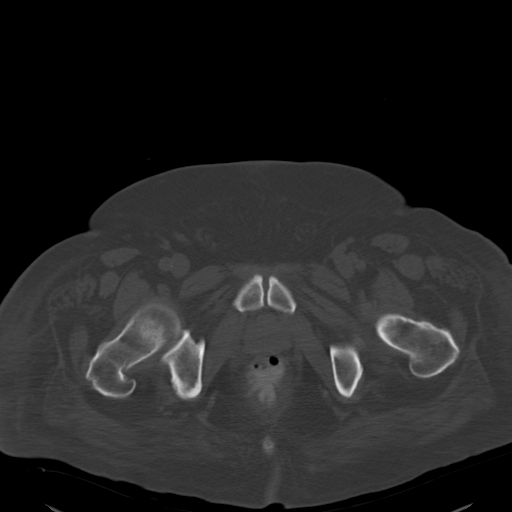
[im 18/109  soft-tissue]
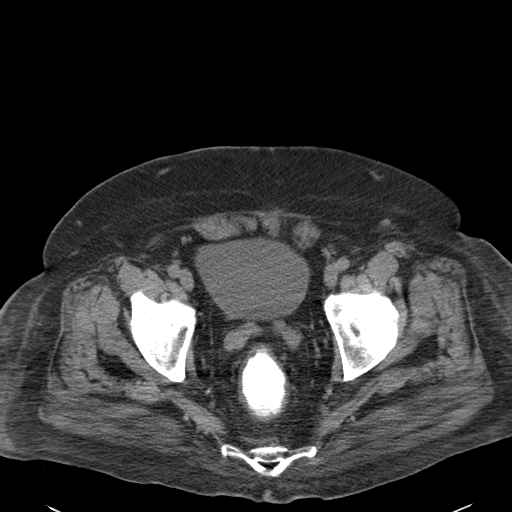
[im 26/109  soft-tissue]
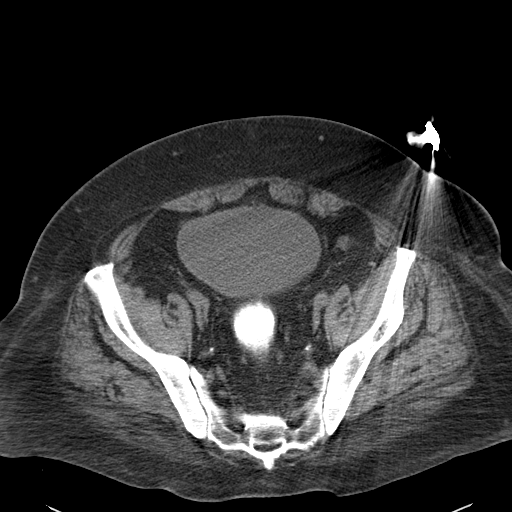
[im 35/109  soft-tissue]
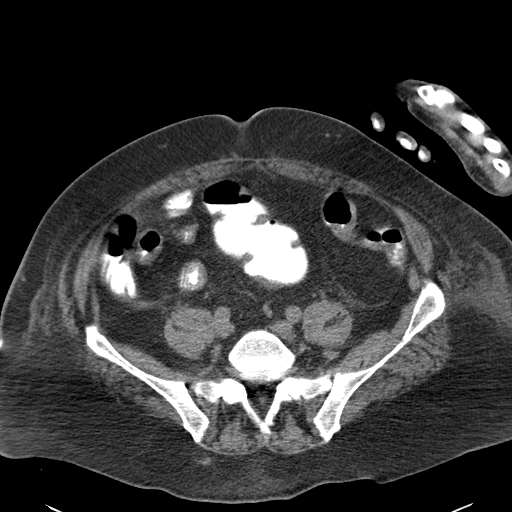
[im 44/109  soft-tissue]
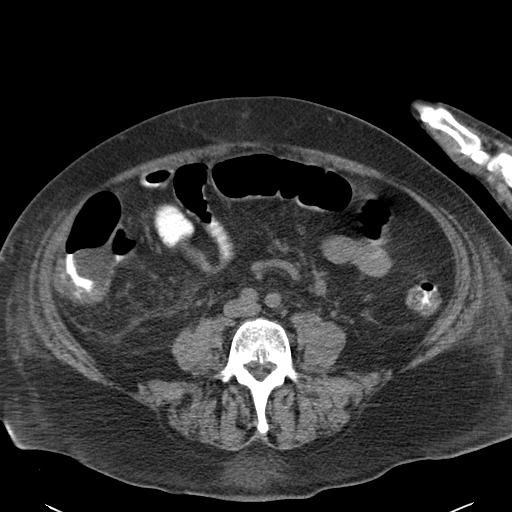
[im 52/109  soft-tissue]
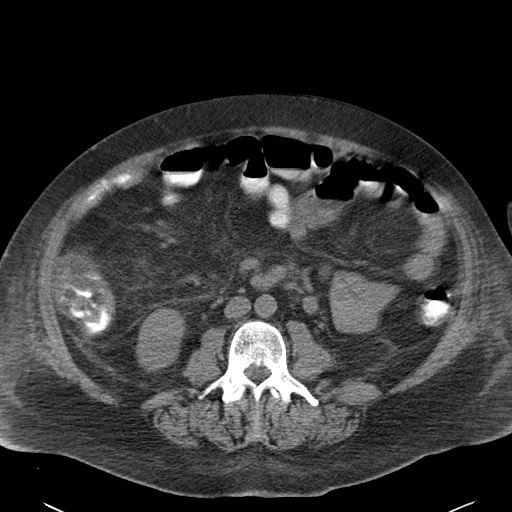
[im 61/109  soft-tissue]
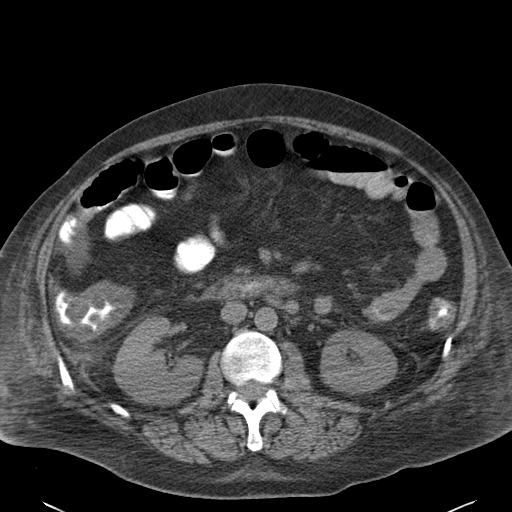
[im 70/109  soft-tissue]
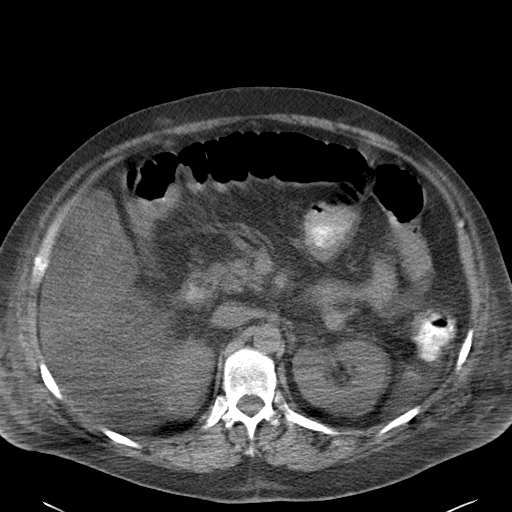
[im 78/109  soft-tissue]
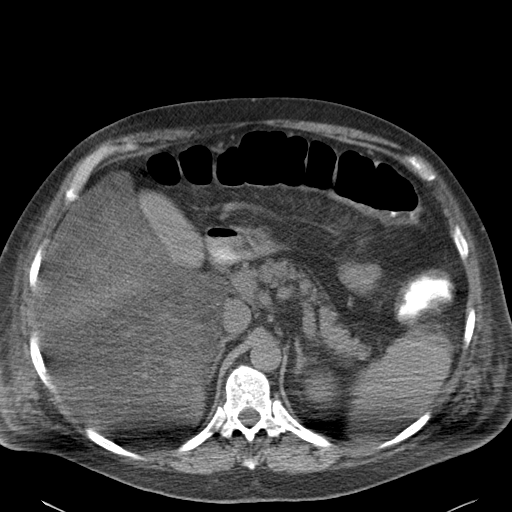
[im 78/109  bone]
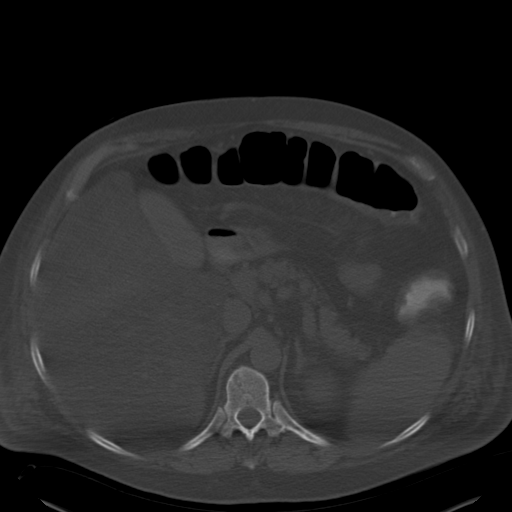
[im 87/109  soft-tissue]
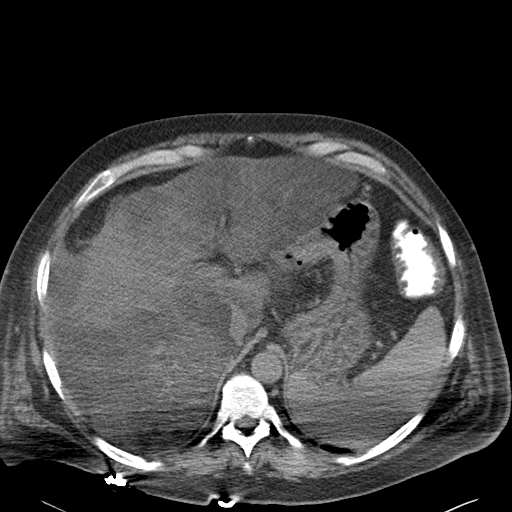
[im 96/109  soft-tissue]
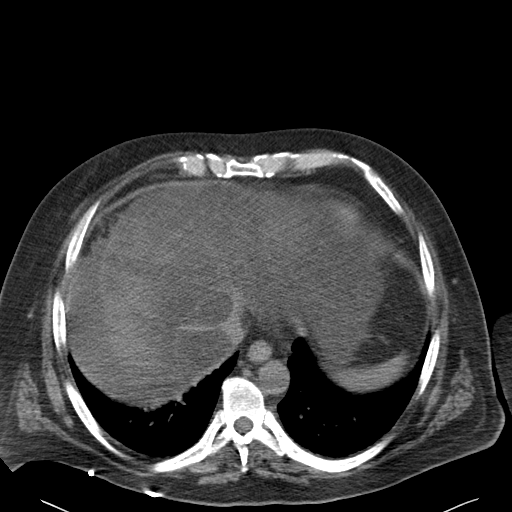
[im 104/109  soft-tissue]
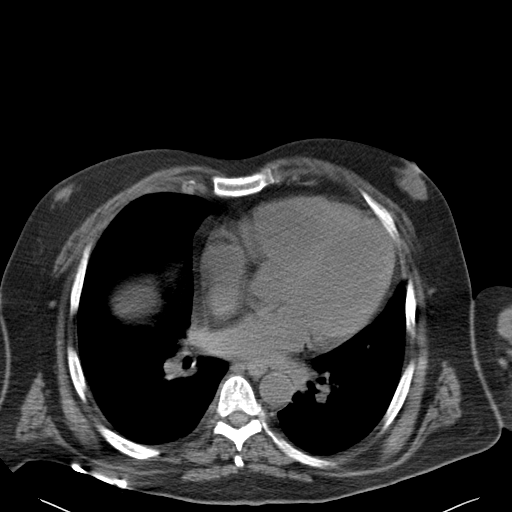

[Series 5: cor routine abd pel wo · coronal · 0.76mm/px · 3 of 155 slices shown]
[im 52/155  soft-tissue]
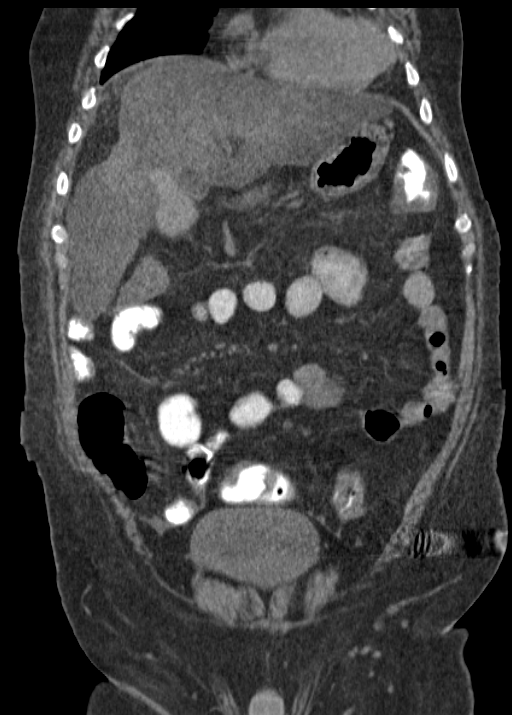
[im 69/155  soft-tissue]
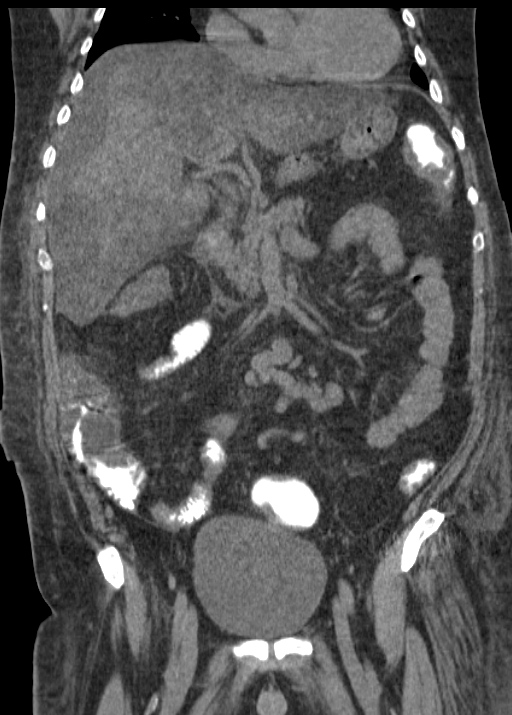
[im 86/155  soft-tissue]
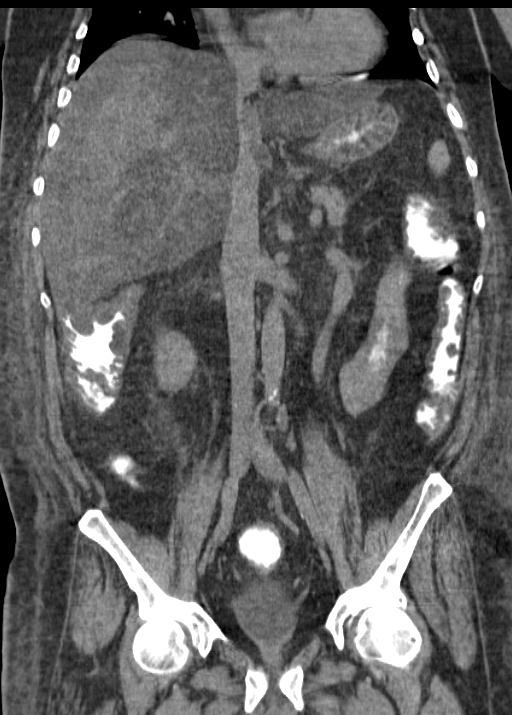

[15 of 46 positions shown; findings below may reference images not displayed]

FINDINGS: BODY WALL: Bilateral gynecomastia.

LOWER CHEST: Cardiomegaly.

ABDOMEN/PELVIS:

Liver: Enlarged liver (19 cm craniocaudal span with patchy marked
hypodensity). No focal masslike lesion is seen, although there is
extensive distortion of the liver surface and vascular structures.
There is evidence of portal venous hypertension with a
well-developed splenorenal shunt.

Biliary: Diffuse high-density consistent with vicarious excretion of
contrast.

Pancreas: No definitive pancreatitis.

Spleen: Borderline enlargement with a 13 cm craniocaudal span.

Adrenals: Unremarkable.

Kidneys and ureters: No hydronephrosis or stone.

Bladder: Unremarkable.

Reproductive: Unremarkable.

Bowel: There is circumferential low-density thickening involving the
proximal colon, possibly also present at the splenic flexure. No
bowel obstruction. Normal appendix.

Retroperitoneum: No mass or adenopathy.

Peritoneum: There is fluid loculated around the right lobe of the
liver, with undulating liver surface. On abdominal ultrasound from 3
days prior this appears simple. There is a small volume of mildly
high-density fluid inferior to the right hepatic lobe.

Vascular: No acute abnormality.

OSSEOUS: No acute abnormalities.

Liver and peritoneal findings were called by telephone at the time
of interpretation on 12/31/2013 at [DATE] to Dr. ZAYNE AYUSO , who
verbally acknowledged these results.
IMPRESSION: 1. Cirrhosis with portal hypertension (splenorenal shunt).
2. Extensive hepatic steatosis, which could easily obscure a mass
lesion. Followup liver MRI recommended. (Non-emergent MRI should be
deferred until patient has been discharged for the acute illness,
and can optimally cooperate with positioning and breath-holding
instructions).
3. Perihepatic ascites. Question trace peritoneal hemorrhage below
the right liver. Correlate for recent biopsy or trauma. This finding
increases the importance of MRI follow-up.
4. Mild proximal colitis versus portal colopathy.
